# Patient Record
Sex: Female | Born: 1970 | Race: White | Hispanic: No | Marital: Single | Smoking: Former smoker
Health system: Southern US, Community
[De-identification: ages and names within clinical notes are randomized; demographics above are authoritative.]

## PROBLEM LIST (undated history)

## (undated) DIAGNOSIS — F329 Major depressive disorder, single episode, unspecified: Secondary | ICD-10-CM

## (undated) DIAGNOSIS — M1990 Unspecified osteoarthritis, unspecified site: Secondary | ICD-10-CM

## (undated) DIAGNOSIS — M940 Chondrocostal junction syndrome [Tietze]: Secondary | ICD-10-CM

## (undated) DIAGNOSIS — L42 Pityriasis rosea: Secondary | ICD-10-CM

## (undated) DIAGNOSIS — F32A Depression, unspecified: Secondary | ICD-10-CM

## (undated) DIAGNOSIS — T7840XA Allergy, unspecified, initial encounter: Secondary | ICD-10-CM

## (undated) DIAGNOSIS — R519 Headache, unspecified: Secondary | ICD-10-CM

## (undated) DIAGNOSIS — M549 Dorsalgia, unspecified: Secondary | ICD-10-CM

## (undated) DIAGNOSIS — K219 Gastro-esophageal reflux disease without esophagitis: Secondary | ICD-10-CM

## (undated) DIAGNOSIS — N200 Calculus of kidney: Secondary | ICD-10-CM

## (undated) HISTORY — DX: Headache, unspecified: R51.9

## (undated) HISTORY — DX: Allergy, unspecified, initial encounter: T78.40XA

## (undated) HISTORY — PX: ABDOMINAL HYSTERECTOMY: SHX81

## (undated) HISTORY — DX: Depression, unspecified: F32.A

## (undated) HISTORY — PX: OTHER SURGICAL HISTORY: SHX169

## (undated) HISTORY — PX: LAPAROSCOPY: SHX197

## (undated) HISTORY — DX: Major depressive disorder, single episode, unspecified: F32.9

## (undated) HISTORY — PX: WISDOM TOOTH EXTRACTION: SHX21

## (undated) HISTORY — PX: LEEP: SHX91

## (undated) HISTORY — PX: TONSILLECTOMY: SUR1361

---

## 1997-12-10 ENCOUNTER — Other Ambulatory Visit: Admission: RE | Admit: 1997-12-10 | Discharge: 1997-12-10 | Payer: Self-pay | Admitting: Obstetrics & Gynecology

## 1998-06-28 ENCOUNTER — Other Ambulatory Visit: Admission: RE | Admit: 1998-06-28 | Discharge: 1998-06-28 | Payer: Self-pay | Admitting: Obstetrics & Gynecology

## 1999-08-11 ENCOUNTER — Other Ambulatory Visit: Admission: RE | Admit: 1999-08-11 | Discharge: 1999-08-11 | Payer: Self-pay | Admitting: Obstetrics & Gynecology

## 2000-11-19 ENCOUNTER — Other Ambulatory Visit: Admission: RE | Admit: 2000-11-19 | Discharge: 2000-11-19 | Payer: Self-pay | Admitting: *Deleted

## 2001-03-21 ENCOUNTER — Emergency Department (HOSPITAL_COMMUNITY): Admission: EM | Admit: 2001-03-21 | Discharge: 2001-03-21 | Payer: Self-pay | Admitting: Emergency Medicine

## 2001-03-29 ENCOUNTER — Ambulatory Visit (HOSPITAL_COMMUNITY): Admission: RE | Admit: 2001-03-29 | Discharge: 2001-03-29 | Payer: Self-pay | Admitting: Urology

## 2001-03-29 ENCOUNTER — Encounter: Payer: Self-pay | Admitting: Urology

## 2002-01-23 ENCOUNTER — Other Ambulatory Visit: Admission: RE | Admit: 2002-01-23 | Discharge: 2002-01-23 | Payer: Self-pay | Admitting: Obstetrics & Gynecology

## 2003-03-03 ENCOUNTER — Other Ambulatory Visit: Admission: RE | Admit: 2003-03-03 | Discharge: 2003-03-03 | Payer: Self-pay | Admitting: Obstetrics & Gynecology

## 2004-06-30 ENCOUNTER — Encounter (INDEPENDENT_AMBULATORY_CARE_PROVIDER_SITE_OTHER): Payer: Self-pay | Admitting: *Deleted

## 2004-06-30 ENCOUNTER — Ambulatory Visit (HOSPITAL_COMMUNITY): Admission: RE | Admit: 2004-06-30 | Discharge: 2004-06-30 | Payer: Self-pay | Admitting: Obstetrics & Gynecology

## 2006-11-30 ENCOUNTER — Encounter (INDEPENDENT_AMBULATORY_CARE_PROVIDER_SITE_OTHER): Payer: Self-pay | Admitting: Specialist

## 2006-11-30 ENCOUNTER — Ambulatory Visit (HOSPITAL_COMMUNITY): Admission: RE | Admit: 2006-11-30 | Discharge: 2006-12-01 | Payer: Self-pay | Admitting: Obstetrics & Gynecology

## 2006-12-18 ENCOUNTER — Encounter: Admission: RE | Admit: 2006-12-18 | Discharge: 2006-12-18 | Payer: Self-pay | Admitting: Family Medicine

## 2007-01-26 ENCOUNTER — Encounter: Admission: RE | Admit: 2007-01-26 | Discharge: 2007-01-26 | Payer: Self-pay | Admitting: Family Medicine

## 2007-02-21 ENCOUNTER — Encounter: Admission: RE | Admit: 2007-02-21 | Discharge: 2007-02-21 | Payer: Self-pay | Admitting: Family Medicine

## 2008-11-26 ENCOUNTER — Encounter: Admission: RE | Admit: 2008-11-26 | Discharge: 2008-11-26 | Payer: Self-pay | Admitting: Family Medicine

## 2009-02-22 ENCOUNTER — Ambulatory Visit (HOSPITAL_BASED_OUTPATIENT_CLINIC_OR_DEPARTMENT_OTHER): Admission: RE | Admit: 2009-02-22 | Discharge: 2009-02-22 | Payer: Self-pay | Admitting: Urology

## 2009-07-20 ENCOUNTER — Encounter: Admission: RE | Admit: 2009-07-20 | Discharge: 2009-07-20 | Payer: Self-pay | Admitting: Anesthesiology

## 2009-09-28 ENCOUNTER — Encounter: Admission: RE | Admit: 2009-09-28 | Discharge: 2009-09-28 | Payer: Self-pay | Admitting: Anesthesiology

## 2009-12-13 ENCOUNTER — Encounter: Admission: RE | Admit: 2009-12-13 | Discharge: 2009-12-13 | Payer: Self-pay | Admitting: Anesthesiology

## 2010-02-21 ENCOUNTER — Encounter: Admission: RE | Admit: 2010-02-21 | Discharge: 2010-02-21 | Payer: Self-pay | Admitting: Physician Assistant

## 2010-03-08 ENCOUNTER — Encounter: Admission: RE | Admit: 2010-03-08 | Discharge: 2010-03-08 | Payer: Self-pay | Admitting: Family Medicine

## 2010-09-22 ENCOUNTER — Encounter
Admission: RE | Admit: 2010-09-22 | Discharge: 2010-09-22 | Payer: Self-pay | Source: Home / Self Care | Attending: Family Medicine | Admitting: Family Medicine

## 2010-12-05 LAB — POCT HEMOGLOBIN-HEMACUE: Hemoglobin: 14.5 g/dL (ref 12.0–15.0)

## 2010-12-05 LAB — POCT PREGNANCY, URINE: Preg Test, Ur: NEGATIVE

## 2010-12-20 ENCOUNTER — Other Ambulatory Visit: Payer: Self-pay | Admitting: Anesthesiology

## 2010-12-20 DIAGNOSIS — M545 Low back pain, unspecified: Secondary | ICD-10-CM

## 2010-12-20 DIAGNOSIS — M542 Cervicalgia: Secondary | ICD-10-CM

## 2010-12-24 ENCOUNTER — Ambulatory Visit
Admission: RE | Admit: 2010-12-24 | Discharge: 2010-12-24 | Disposition: A | Discharge status: Home / Self Care | Payer: BC Managed Care – PPO | Source: Ambulatory Visit | Attending: Anesthesiology | Admitting: Anesthesiology

## 2010-12-24 DIAGNOSIS — M545 Low back pain, unspecified: Secondary | ICD-10-CM

## 2010-12-24 DIAGNOSIS — M542 Cervicalgia: Secondary | ICD-10-CM

## 2011-01-10 NOTE — Op Note (Signed)
Sherry [name], Sherry [name]            ACCOUNT NO.:  [id number]   MEDICAL RECORD NO.:  [id number]          PATIENT TYPE:  AMB   LOCATION:  [facility]                         FACILITY:  [facility]   PHYSICIAN:  Mark C. Vernie Ammons, M.D.  DATE OF BIRTH:  1970-12-15   DATE OF PROCEDURE:  02/22/2009  DATE OF DISCHARGE:                               OPERATIVE REPORT   PREOPERATIVE DIAGNOSES:  1. Right ureteral calculus.  2. Left-sided flank pain.   POSTOPERATIVE DIAGNOSES:  1. Right ureteral calculus.  2. Left-sided flank pain.   PROCEDURE:  1. Bilateral retrograde pyelograms with interpretation.  2. Right ureteroscopy.  3. In situ laser lithotripsy.  4. Right ureteral stone extraction.  5. Right double-J stent placement (5-French, 24 cm with string).   SURGEON:  Mark C. Vernie Ammons, M.D.   ANESTHESIA:  General.   SPECIMENS:  Stone given to the patient.   DRAIN:  Double-J stent in the right ureter.   BLOOD LOSS:  Minimal.   COMPLICATIONS:  None.   INDICATIONS:  The patient is a 40 year old white female who was found to  have a right mid ureteral calculus that was followed with serial KUBs  and medical expulsive therapy.  Despite conservative therapy, the stone  did not progress and we discussed ureteroscopic treatment of the stone.  I have gone over the risks, complications and alternatives.  The patient  understands and has elected to proceed.  In the preop holding area she  mentioned that she was having some left-sided pain as well and I  indicated I would also evaluate the left side with a retrograde  pyelogram.   DESCRIPTION OF OPERATION:  After informed consent, the patient was  brought to the major OR, placed on the table, administered general  anesthesia, then moved to the dorsal lithotomy position.  Her genitalia  was sterilely and prepped and draped and an official time-out was then  performed.   The 22-French cystoscope with 12 degrees lens was then passed in the  bladder and  bladder was fully and systematically inspected and noted to  be free of any tumor, stones or inflammatory lesions.  Ureteral orifices  were of normal configuration and position.   A 6-French open-ended ureteral catheter was then passed through the  cystoscope and into the right ureteral orifice.  Full strength contrast  was then used to perform a retrograde pyelogram in standard fashion  using real time fluoroscopy.  This revealed a normal-appearing ureter up  to the level of L3 where the stone was previously seen on KUB which was  noted as a filling defect in the ureter.  The ureter proximal to that  appeared normal in appearance.  There was no evidence of hydronephrosis.   A 0.038 inches floppy tip guidewire was then passed through the open-  ended catheter and up to the stone but with gentle manipulation, I was  unable to negotiate the guidewire past the stone and therefore left it  at that location just below the stone.   A left retrograde pyelogram was then performed in identical fashion.  The findings were those of a normal ureter  throughout its length.  There  was no evidence of filling defect, mass effect or other abnormality.  The contrast was noted to pass up the ureter unobstructed and then  observed under fluoroscopy to pass down the ureter unobstructed as well.   The inner cannula of the ureteral access sheath was first passed over  the guidewire that was going up the right ureter to gently dilate the  intramural ureter.  I then removed that and placed the inner cannula  within the ureteral access sheath and passed this over the guidewire to  just below the location of the stone removing the guidewire and inner  cannula and leaving the ureteral access sheath in place.   A 6-French flexible digital ureteroscope was then passed through the  access sheath and into the ureter.  I was able to identify the stone and  it was photographed.  There was a lot of edema of the ureter  surrounding  the stone.  A 200 micron holmium laser fiber was passed through the  ureteroscope and used to fragment the stone.  I then removed the laser  fiber and inserted the nitinol basket and grasped the largest fragment  and removed that without difficulty.  I then passed the scope back in  and removed the remaining fragments.   The guidewire was then passed through the ureteroscope and the  ureteroscope removed with the guide wire being left in place and the  cystoscope was back loaded over the guidewire.  The stent was then  passed over the guidewire and as the guidewire was removed, good curl  was noted in the renal pelvis.  The bladder was drained and the  cystoscope removed.  The patient received a B and O suppository and the  stent tether was affixed to the mons pubis region.  The patient  tolerated procedure well, and there were no intraoperative  complications.  She will be given a prescription for Pyridium 200 mg #40  and Vicodin HP #36.  She will then follow up in my office in 1 week for  stent removal.      Mark C. Vernie Ammons, M.D.  Electronically Signed     MCO/MEDQ  D:  02/22/2009  T:  02/22/2009  Job:  [id number]

## 2011-01-13 NOTE — Op Note (Signed)
[name], [name]            ACCOUNT NO.:  [id number]   MEDICAL RECORD NO.:  [id number]          PATIENT TYPE:  AMB   LOCATION:  DAY                          FACILITY:  [facility]   PHYSICIAN:  Genia Del, M.D.DATE OF BIRTH:  July 14, 1971   DATE OF PROCEDURE:  11/30/2006  DATE OF DISCHARGE:                               OPERATIVE REPORT   OPERATIVE PROTOCOL:   PREOPERATIVE DIAGNOSIS:  Pelvic pains and endometriosis. Persistent low-  grade squamous intraepithelial lesion, and vulvar and perianal  condylomas.   PROCEDURE:  Total laparoscopy hysterectomy, assisted with DaVinci robot,  and the laser CO2 of vulvar and perianal lesions.   SURGEON:  Genia Del, M.D.   ASSISTANT:  Cordelia Pen A. Rosalio Macadamia, M.D.   PROCEDURE:  Under general anesthesia with endotracheal intubation, the  patient is in lithotomy position.  She is prepped with Betadine on the  abdominal, suprapubic, vulvar, and vaginal areas.  The bladder catheter  is inserted, and the patient is draped as usual.  The Rumi with a Koh  ring are put in place vaginally.  The vaginal exam just prior to that  revealed an anteverted uterus, mobile, no adnexal mass.  We make that  supraumbilical incision with the scalpel over 1.5 cm after infiltrating  Marcaine.  We open layer by layer. The aponeurosis is opened with Mayo  scissors.  A pursestring stitch is put with Vicryl 0 at that level.  The  parietal peritoneum is opened bluntly with a finger.  We insert the  Hasson with a camera at that level.  The inspection of the  abdominopelvic cavity is within normal.  No adhesion is seen.  We do  that after creating a pneumoperitoneum with CO2.  We measure all port  sites. Three robotic ports are inserted and one assistant port.  We then  dock the robot as usual and insert the instruments, an Endoshears  scissor, a fenestrated bipolar, and a Cobra clamp.  We start at the  console.  We cauterize in sections on the left side the  round ligament,  the tube, and the utero-ovarian ligament.  We progress along the left  lateral side of the uterus, open the visceral peritoneum over the lower  uterine segment.  We proceed the same way on the right side and bring  the bladder down anteriorly.  We then cauterize in sections the uterine  artery on the left side and then on the right side.  We noted that the  uterus is blanching. We then open the vagina anteriorly following the  [facility] ring all along. The uterus is completely detached and removed  vaginally.  We then change instruments. Two needle drivers were used,  and the fenestrated bipolar is transferred to the fourth arm. We close  the vagina with figure-of-eight Vicryl 0.  It is closed completely, and  hemostasis is good at all levels.  The instruments are therefore  removed, and the robot is undocked. The trocars are removed. The CO2 is  evacuated.  The suture at the supraumbilical incision is attached on the  aponeurosis, and we do a subcuticular Vicryl 0 at all  incisions and add  Dermabond on top.  Note that both ovaries were normal to inspection.  No  endometriosis was seen at that level.  The two tubes were normal in  appearance.  The uterus was normal size.  Minimal inactive endometriosis  was present at the posterior aspect of the uterus toward the cul-de-sac.  We then go to the vulva and perianal areas and use the CO2 laser to  fulgurate three lesions in the perianal area, one on the posterior right  vulva and one on the mid anterior left vulva.  The perianal lesions look  like condyloma. The right vulvar lesion looks like probable old  molluscum contagiosum,  and the left anterior mid vulvar lesion looked like possible condyloma  or mild dysplasia. The estimated blood loss was less than 50 cc.  No  complication occurred.  The patient received Ancef 1 g IV at the  beginning of the intervention. The count of instruments and sponges was  complete.       Genia Del, M.D.  Electronically Signed     ML/MEDQ  D:  11/30/2006  T:  11/30/2006  Job:  [id number]

## 2011-01-13 NOTE — Op Note (Signed)
[facility]  Patient:    Sherry [name], Sherry [name]                   MRN: [id number] Proc. Date: 03/29/01 Adm. Date:  [id number] Attending:  Trisha Mangle                           Operative Report  PREOPERATIVE DIAGNOSIS:  Right ureteral calculus.  POSTOPERATIVE DIAGNOSIS:  Right ureteral calculus.  OPERATION: 1. Cystoscopy. 2. Right ureteroscopy and stone extraction.  SURGEON:  Mark C. Vernie Ammons, M.D.  ANESTHESIA:  General.  SPECIMEN:  Stone given to patient.  DRAINS:  None.  BLOOD LOSS:  None.  COMPLICATIONS:  None.  INDICATIONS:  The patient is a 40 year old white female white female who is having intermittent right flank pain.  A CT and followup KUB revealed a right ureteral calculus.  She began to have more pain and some irritative voiding symptoms consistent with progression of the stone, and it was found to be more distal in the ureter.  She had not passed the stone, and I offered her continued observation versus extraction of the stone.  We also discussed the use of a stent, but, with its distal location, we also discussed possible procedure without the use of the stent, and she would like to proceed with that, understanding the risks and complications and limitations associated with this including ureteral spasm and pain.  She did receive preoperative Cipro and has elected to procedure with surgery.  DESCRIPTION OF PROCEDURE:  After informed consent, the patient was brought to the major OR, was placed on the table, and administered general anesthesia and then moved into the dorsolithotomy position.  Her genitalia was sterilely prepped and draped, and a 6-French ureteroscope was then introduced into the bladder.  The bladder was noted to be free of any tumors, stones, or inflammatory lesions.  The right ureteral orifice was identified, and 0.038-inch floppy tip guidewire was then passed up the right ureter under direct fluoroscopic  control.  Next to the guidewire then, I passed the 6-French ureteroscope without difficulty or need to dilate.  I was able to identify the stone and remove the guidewire.  I then passed a Segura basket through the ureteroscope and engaged the stone which was seen visually and fluoroscopically.  I then extracted the stone, basket, and ureteroscope without difficulty.  The bladder was drained.  The patient was awakened and taken to the recovery room in stable and satisfactory condition.  She tolerated the procedure well with no intraoperative complications.  She was given a #16A B & O suppository at the end of the operation and will be given a prescription for 28 Vicodin ES and will follow up in my office in one week for recheck. DD:  03/29/01 TD:  03/30/01 Job: [id number]

## 2011-01-13 NOTE — Op Note (Signed)
Sherry [name], Sherry [name]            ACCOUNT NO.:  [id number]   MEDICAL RECORD NO.:  [id number]          PATIENT TYPE:  AMB   LOCATION:  [facility]                           FACILITY:  [facility]   PHYSICIAN:  Genia Del, M.D.DATE OF BIRTH:  07-20-1971   DATE OF PROCEDURE:  06/30/2004  DATE OF DISCHARGE:                                 OPERATIVE REPORT   PREOPERATIVE DIAGNOSES:  1.  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  2.  History of loop electrosurgical excision procedure associated with      severe pain in 1998.   POSTOPERATIVE DIAGNOSES:  1.  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  2.  History of loop electrosurgical excision procedure associated with      severe pain in 1998.   INTERVENTION:  Colposcopy with LEEP under MAC analgesia and paracervical and  cervical block.   SURGEON:  Genia Del, M.D.   ANESTHESIA:  Burnett Corrente, M.D.   DESCRIPTION OF PROCEDURE:  Under MAC analgesia, the patient is in lithotomy  position. She is draped as usual. No lesion is seen on the vulva. The  speculum is introduced in the vagina.  Acetic acid is applied on the cervix  and colposcopy is done. We note moderate acetowhite change with moderate  punctation in the quadrant from 6-9 o'clock and minor acetowhite and minor  punctation around the rest of the transformation zone. We do a paracervical  and cervical block with a total of 30 mL of Marcaine 5% with epinephrine.  The paracervical block is done at 4 and 8 o'clock and the cervical block at  4, 8 and 12 o'clock. We then proceed with the LEEP with cutting current  starting just external to the area of moderate punctation at 6-9 o'clock and  proceed to finish the LEEP between 12 and 3 o'clock. The specimen is marked  at 12 o'clock and sent to pathology. We then complete hemostasis with the  __________ with coag current and then with Monsel.  Hemostasis is adequate.  Estimated blood loss was minimal. No complication occurred and the patient  was transferred to the  recovery room in good status.      ML/MEDQ  D:  06/30/2004  T:  06/30/2004  Job:  [id number]

## 2011-02-10 ENCOUNTER — Other Ambulatory Visit: Payer: Self-pay | Admitting: Physician Assistant

## 2011-02-10 DIAGNOSIS — M545 Low back pain, unspecified: Secondary | ICD-10-CM

## 2011-02-16 ENCOUNTER — Ambulatory Visit
Admission: RE | Admit: 2011-02-16 | Discharge: 2011-02-16 | Disposition: A | Discharge status: Home / Self Care | Payer: BC Managed Care – PPO | Source: Ambulatory Visit | Attending: Physician Assistant | Admitting: Physician Assistant

## 2011-02-16 DIAGNOSIS — M545 Low back pain, unspecified: Secondary | ICD-10-CM

## 2011-02-22 ENCOUNTER — Emergency Department (HOSPITAL_BASED_OUTPATIENT_CLINIC_OR_DEPARTMENT_OTHER)
Admission: EM | Admit: 2011-02-22 | Discharge: 2011-02-22 | Disposition: A | Discharge status: Home / Self Care | Payer: BC Managed Care – PPO | Attending: Emergency Medicine | Admitting: Emergency Medicine

## 2011-02-22 ENCOUNTER — Emergency Department (INDEPENDENT_AMBULATORY_CARE_PROVIDER_SITE_OTHER): Payer: BC Managed Care – PPO

## 2011-02-22 DIAGNOSIS — M542 Cervicalgia: Secondary | ICD-10-CM

## 2011-02-22 DIAGNOSIS — M545 Low back pain, unspecified: Secondary | ICD-10-CM

## 2011-02-22 DIAGNOSIS — Y92410 Unspecified street and highway as the place of occurrence of the external cause: Secondary | ICD-10-CM | POA: Insufficient documentation

## 2011-02-22 DIAGNOSIS — G8929 Other chronic pain: Secondary | ICD-10-CM | POA: Insufficient documentation

## 2011-02-22 DIAGNOSIS — Z79899 Other long term (current) drug therapy: Secondary | ICD-10-CM | POA: Insufficient documentation

## 2011-02-22 DIAGNOSIS — M5137 Other intervertebral disc degeneration, lumbosacral region: Secondary | ICD-10-CM

## 2011-02-22 DIAGNOSIS — M47812 Spondylosis without myelopathy or radiculopathy, cervical region: Secondary | ICD-10-CM

## 2011-05-03 ENCOUNTER — Other Ambulatory Visit: Payer: Self-pay | Admitting: Obstetrics & Gynecology

## 2011-05-03 DIAGNOSIS — Z1231 Encounter for screening mammogram for malignant neoplasm of breast: Secondary | ICD-10-CM

## 2011-05-23 ENCOUNTER — Ambulatory Visit
Admission: RE | Admit: 2011-05-23 | Discharge: 2011-05-23 | Disposition: A | Discharge status: Home / Self Care | Payer: PRIVATE HEALTH INSURANCE | Source: Ambulatory Visit | Attending: Obstetrics & Gynecology | Admitting: Obstetrics & Gynecology

## 2011-05-23 DIAGNOSIS — Z1231 Encounter for screening mammogram for malignant neoplasm of breast: Secondary | ICD-10-CM

## 2011-07-30 ENCOUNTER — Encounter: Payer: Self-pay | Admitting: *Deleted

## 2011-07-30 ENCOUNTER — Emergency Department (INDEPENDENT_AMBULATORY_CARE_PROVIDER_SITE_OTHER): Payer: BC Managed Care – PPO

## 2011-07-30 ENCOUNTER — Emergency Department (HOSPITAL_BASED_OUTPATIENT_CLINIC_OR_DEPARTMENT_OTHER)
Admission: EM | Admit: 2011-07-30 | Discharge: 2011-07-31 | Disposition: A | Discharge status: Home / Self Care | Payer: BC Managed Care – PPO | Attending: Emergency Medicine | Admitting: Emergency Medicine

## 2011-07-30 DIAGNOSIS — K625 Hemorrhage of anus and rectum: Secondary | ICD-10-CM

## 2011-07-30 DIAGNOSIS — J984 Other disorders of lung: Secondary | ICD-10-CM | POA: Insufficient documentation

## 2011-07-30 DIAGNOSIS — N63 Unspecified lump in unspecified breast: Secondary | ICD-10-CM

## 2011-07-30 DIAGNOSIS — J45909 Unspecified asthma, uncomplicated: Secondary | ICD-10-CM | POA: Insufficient documentation

## 2011-07-30 DIAGNOSIS — K219 Gastro-esophageal reflux disease without esophagitis: Secondary | ICD-10-CM | POA: Insufficient documentation

## 2011-07-30 DIAGNOSIS — N949 Unspecified condition associated with female genital organs and menstrual cycle: Secondary | ICD-10-CM

## 2011-07-30 DIAGNOSIS — Z8739 Personal history of other diseases of the musculoskeletal system and connective tissue: Secondary | ICD-10-CM | POA: Insufficient documentation

## 2011-07-30 DIAGNOSIS — K6289 Other specified diseases of anus and rectum: Secondary | ICD-10-CM | POA: Insufficient documentation

## 2011-07-30 DIAGNOSIS — R911 Solitary pulmonary nodule: Secondary | ICD-10-CM

## 2011-07-30 DIAGNOSIS — R109 Unspecified abdominal pain: Secondary | ICD-10-CM

## 2011-07-30 HISTORY — DX: Dorsalgia, unspecified: M54.9

## 2011-07-30 HISTORY — DX: Unspecified osteoarthritis, unspecified site: M19.90

## 2011-07-30 HISTORY — DX: Gastro-esophageal reflux disease without esophagitis: K21.9

## 2011-07-30 HISTORY — DX: Pityriasis rosea: L42

## 2011-07-30 HISTORY — DX: Calculus of kidney: N20.0

## 2011-07-30 HISTORY — DX: Chondrocostal junction syndrome (tietze): M94.0

## 2011-07-30 LAB — COMPREHENSIVE METABOLIC PANEL
ALT: 10 U/L (ref 0–35)
AST: 15 U/L (ref 0–37)
Albumin: 4.2 g/dL (ref 3.5–5.2)
CO2: 25 mEq/L (ref 19–32)
Calcium: 9.6 mg/dL (ref 8.4–10.5)
Chloride: 102 mEq/L (ref 96–112)
GFR calc non Af Amer: >90 mL/min (ref >90)
Sodium: 137 mEq/L (ref 135–145)
Total Bilirubin: 0.2 mg/dL — ABNORMAL LOW (ref 0.3–1.2)

## 2011-07-30 LAB — CBC
HCT: 40.7 % (ref 36.0–46.0)
MCHC: 34.9 g/dL (ref 30.0–36.0)
Platelets: 441 10*3/uL — ABNORMAL HIGH (ref 150–400)
RDW: 12.4 % (ref 11.5–15.5)
WBC: 13.1 10*3/uL — ABNORMAL HIGH (ref 4.0–10.5)

## 2011-07-30 MED ORDER — METRONIDAZOLE 500 MG PO TABS: 500.0000 mg | ORAL_TABLET | Freq: Two times a day (BID) | ORAL | Status: AC

## 2011-07-30 MED ORDER — IOHEXOL 300 MG/ML  SOLN: 100.0000 mL | Freq: Once | INTRAMUSCULAR | Status: AC | PRN

## 2011-07-30 MED ORDER — CIPROFLOXACIN HCL 500 MG PO TABS: 500.0000 mg | ORAL_TABLET | Freq: Two times a day (BID) | ORAL | Status: AC

## 2011-07-30 MED ORDER — DIPHENHYDRAMINE HCL 50 MG/ML IJ SOLN
25.0000 mg | Freq: Once | INTRAMUSCULAR | Status: AC
  Administered 2011-07-30: 22:00:00 via INTRAVENOUS
  Filled 2011-07-30: qty 1

## 2011-07-30 NOTE — ED Notes (Signed)
Pt reports blood and mucous in BM x 4 weeks- has appt on Friday with GI doctor- today pt reports sx worsening with cramping- has been taking herbs with some relief

## 2011-07-30 NOTE — ED Notes (Signed)
Pt reports rectal bleeding over past month with abd discomfort and R inguinal pain

## 2011-07-30 NOTE — ED Provider Notes (Signed)
History     CSN: [id number] Arrival date & time: 07/30/2011  8:00 PM   First MD Initiated Contact with Patient 07/30/11 2058      Chief Complaint  Patient presents with  . Rectal Bleeding    (Consider location/radiation/quality/duration/timing/severity/associated sxs/prior treatment) HPI Comments: Pt states that she is having mucus tinged blood:pt states that she had the symptoms after doing an herbal cleanse:pt states that the symptoms have lasted for 4 weeks:pt states that she has no history  Of similar symptoms:pt denies n/v/d or fever:pt states that she has an appointment with gi in 5 days  Patient is a 40 y.o. female presenting with hematochezia. The history is provided by the patient. No language interpreter was used.  Rectal Bleeding  The current episode started more than 2 weeks ago. The problem occurs continuously. The problem has been unchanged. The pain is mild. The stool is described as mixed with blood and streaked with blood. There was no prior successful therapy. Pertinent negatives include no hemorrhoids, no nausea, no chest pain and no coughing.    Past Medical History  Diagnosis Date  . Arthritis   . Asthma   . Kidney stones   . GERD (gastroesophageal reflux disease)   . Pityriasis rosea   . Costochondritis   . Back pain     Past Surgical History  Procedure Date  . Abdominal hysterectomy   . Leep   . Tonsillectomy   . Wisdom tooth extraction   . Laparoscopy   . Kidney stones     No family history on file.  History  Substance Use Topics  . Smoking status: Former Games developer  . Smokeless tobacco: Not on file  . Alcohol Use: Yes     occasional    OB History    Grav Para Term Preterm Abortions TAB SAB Ect Mult Living                  Review of Systems  Respiratory: Negative for cough.   Cardiovascular: Negative for chest pain.  Gastrointestinal: Positive for hematochezia. Negative for nausea and hemorrhoids.  All other systems reviewed and are  negative.    Allergies  Latex and Iohexol  Home Medications   Current Outpatient Rx  Name Route Sig Dispense Refill  . ALPRAZOLAM 0.5 MG PO TABS Oral Take 0.5 mg by mouth 2 (two) times daily as needed. For panic attack      . FLUOXETINE HCL 40 MG PO CAPS Oral Take 40 mg by mouth daily.      Marland Kitchen HYDROCODONE-ACETAMINOPHEN 10-325 MG PO TABS Oral Take 1-2 tablets by mouth every 8 (eight) hours as needed. For pain      . LORATADINE-PSEUDOEPHEDRINE ER 5-120 MG PO TB12 Oral Take 1 tablet by mouth daily.      Marland Kitchen PRESCRIPTION MEDICATION Topical Apply 1 application topically daily as needed. Psoriasis cream     . RANITIDINE HCL 150 MG PO TABS Oral Take 150 mg by mouth 2 (two) times daily as needed. For reflux       BP 112/80  Pulse 88  Temp(Src) 98.3 F (36.8 C) (Oral)  Resp 20  Ht 5\' 7"  (1.702 m)  Wt 200 lb (90.719 kg)  BMI 31.32 kg/m2  SpO2 98%  Physical Exam  Nursing note and vitals reviewed. Constitutional: She appears well-developed and well-nourished.  Neck: Normal range of motion. Neck supple.  Cardiovascular: Normal rate and regular rhythm.   Pulmonary/Chest: Effort normal and breath sounds normal.  Abdominal: Soft.  There is tenderness.  Genitourinary:       Pt no gross red blood on exam  Musculoskeletal: Normal range of motion.  Neurological: She is alert.  Skin: Skin is warm and dry.  Psychiatric: She has a normal mood and affect.    ED Course  Procedures (including critical care time)  Labs Reviewed  CBC - Abnormal; Notable for the following:    WBC 13.1 (*)    Platelets 441 (*)    All other components within normal limits  COMPREHENSIVE METABOLIC PANEL - Abnormal; Notable for the following:    Total Bilirubin 0.2 (*)    All other components within normal limits  OCCULT BLOOD X 1 CARD TO LAB, STOOL  POCT OCCULT BLOOD STOOL, DEVICE   Ct Abdomen Pelvis W Contrast  07/30/2011  *RADIOLOGY REPORT*  Clinical Data: Worsening pelvic pain and cramping; blood and  mucus in bowel movements.  CT ABDOMEN AND PELVIS WITH CONTRAST  Technique:  Multidetector CT imaging of the abdomen and pelvis was performed following the standard protocol during bolus administration of intravenous contrast.  Contrast:  100 mL of Omnipaque 300 IV contrast  Comparison: CT of the lumbar spine performed 02/16/2011  Findings: There is an unusual mass-like appearance to partially visualized fibroglandular tissue within the left breast.  Though this could reflect unusually prominent cystic lesions, a 3.7 cm mass cannot be excluded.  Correlation with prior mammograms, and follow-up mammogram, are recommended for further evaluation.  There is a small 6 mm pleural based nodule noted at the left lung base (image 7 of 30), and a small 4 mm nodule noted more inferiorly at the left lung base (image 13 of 30).  A smaller nodular density within the inferior right middle lobe is nonspecific in appearance.  The liver and spleen are unremarkable in appearance.  The gallbladder is within normal limits.  The pancreas and adrenal glands are unremarkable.  The kidneys are unremarkable in appearance.  There is no evidence of hydronephrosis.  No renal or ureteral stones are seen.  No perinephric stranding is appreciated.  No free fluid is identified.  The small bowel is unremarkable in appearance.  The stomach is within normal limits.  No acute vascular abnormalities are seen.  A circumaortic left renal vein is incidentally noted.  The appendix is normal in caliber and contains air, without evidence for appendicitis.  Mild apparent mucosal thickening along the ascending colon likely reflects relative decompression.  The colon is grossly unremarkable in appearance.  Mild diffuse wall thickening along the inferior rectum may reflect proctitis, but is nonspecific in appearance.  The bladder is mildly distended and grossly remarkable in appearance.  The patient is status post hysterectomy; the ovaries are relatively  symmetric.  No suspicious adnexal masses are seen. No inguinal lymphadenopathy is seen.  No acute osseous abnormalities are identified.  Disc space narrowing is noted at L5-S1, with associated endplate sclerotic change.  IMPRESSION:  1.  Mild diffuse wall thickening along the inferior rectum is nonspecific, though it may reflect proctitis. 2.  Unusual mass-like appearance to partially visualized fibroglandular tissue within the left breast.  Though this could reflect unusually prominent cystic lesions, a 3.7 cm left breast mass cannot be excluded.  Correlation with prior mammograms, and follow-up mammogram and breast exam, are recommended for further evaluation. 3.  Two small nodules noted at the left lung base, the larger of which measures 6 mm in size.  If the patient is at high risk for bronchogenic carcinoma,  follow-up chest CT at 6-12 months is recommended.  If the patient is at low risk for bronchogenic carcinoma, follow-up chest CT at 12 months is recommended.  This recommendation follows the consensus statement: Guidelines for Management of Small Pulmonary Nodules Detected on CT Scans: A Statement from the Fleischner Society as published in Radiology 2005; 237:395-400. Online at: DietDisorder.cz. 4.  Circumaortic left renal vein incidentally noted.  Original Report Authenticated By: Tonia Ghent, M.D.     1. Proctitis   2. Rectal bleeding   3. Lung nodule   4. Breast mass       MDM  Discussed findings with pt:pt aware of lung mass:pt to follow up with pcp for mammogram:pt to be treated with cipro/flagyl as per discussion with Dr. Jacky Kindle, NP 07/30/11 [phone]

## 2011-07-30 NOTE — ED Notes (Signed)
BSC taken to room

## 2011-07-31 NOTE — ED Provider Notes (Signed)
Medical screening examination/treatment/procedure(s) were performed by non-physician practitioner and as supervising physician I was immediately available for consultation/collaboration.  Vedanshi Massaro, MD 07/31/11 0803 

## 2011-08-01 ENCOUNTER — Other Ambulatory Visit: Payer: Self-pay | Admitting: Family Medicine

## 2011-08-01 ENCOUNTER — Ambulatory Visit
Admission: RE | Admit: 2011-08-01 | Discharge: 2011-08-01 | Disposition: A | Discharge status: Home / Self Care | Payer: BC Managed Care – PPO | Source: Ambulatory Visit | Attending: Family Medicine | Admitting: Family Medicine

## 2011-08-01 DIAGNOSIS — N63 Unspecified lump in unspecified breast: Secondary | ICD-10-CM

## 2011-08-07 ENCOUNTER — Other Ambulatory Visit: Payer: Self-pay | Admitting: Gastroenterology

## 2011-10-24 ENCOUNTER — Other Ambulatory Visit: Payer: Self-pay | Admitting: Physician Assistant

## 2011-11-03 IMAGING — CR DG CHEST 2V
2 series · 2 of 2 positions shown · non-contrast
Comparison: CT chest scan of 11/26/2008

CLINICAL DATA: Pain in the left chest, history of asthma

CHEST - 2 VIEW

[view not recorded (1 of 2)]
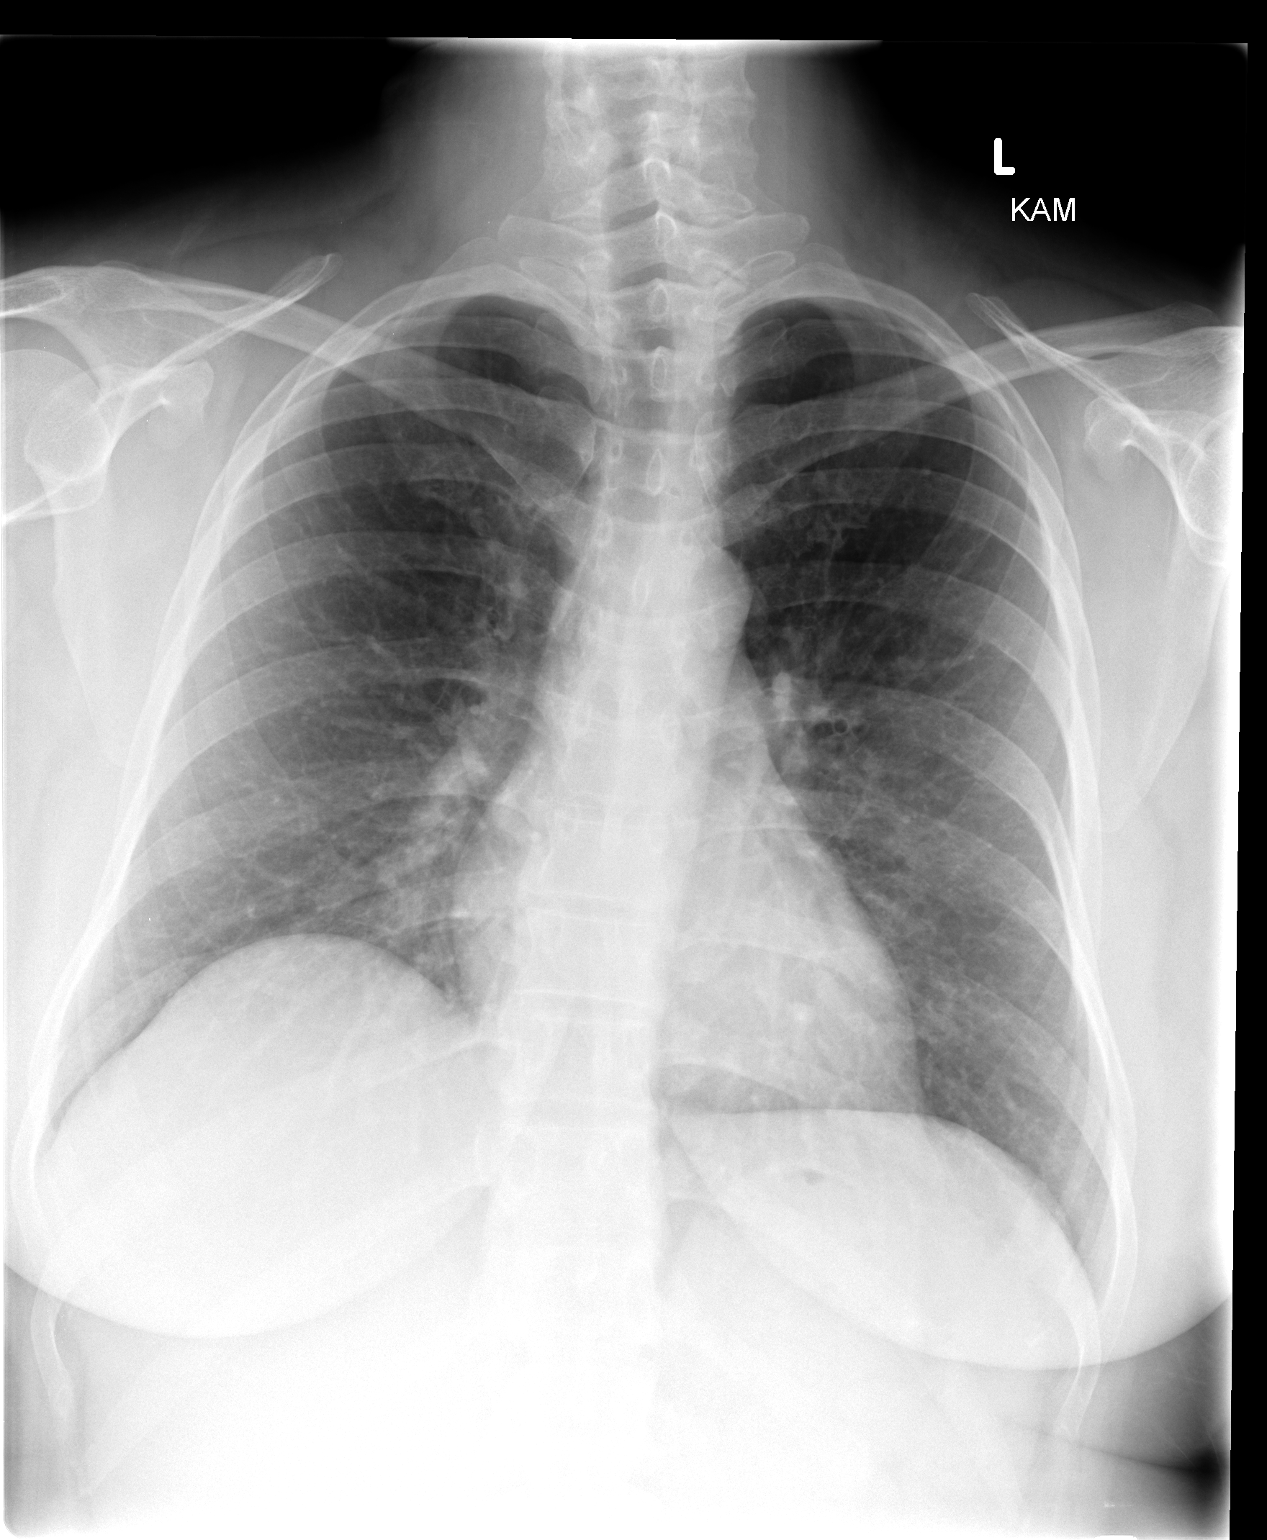

[view not recorded (2 of 2)]
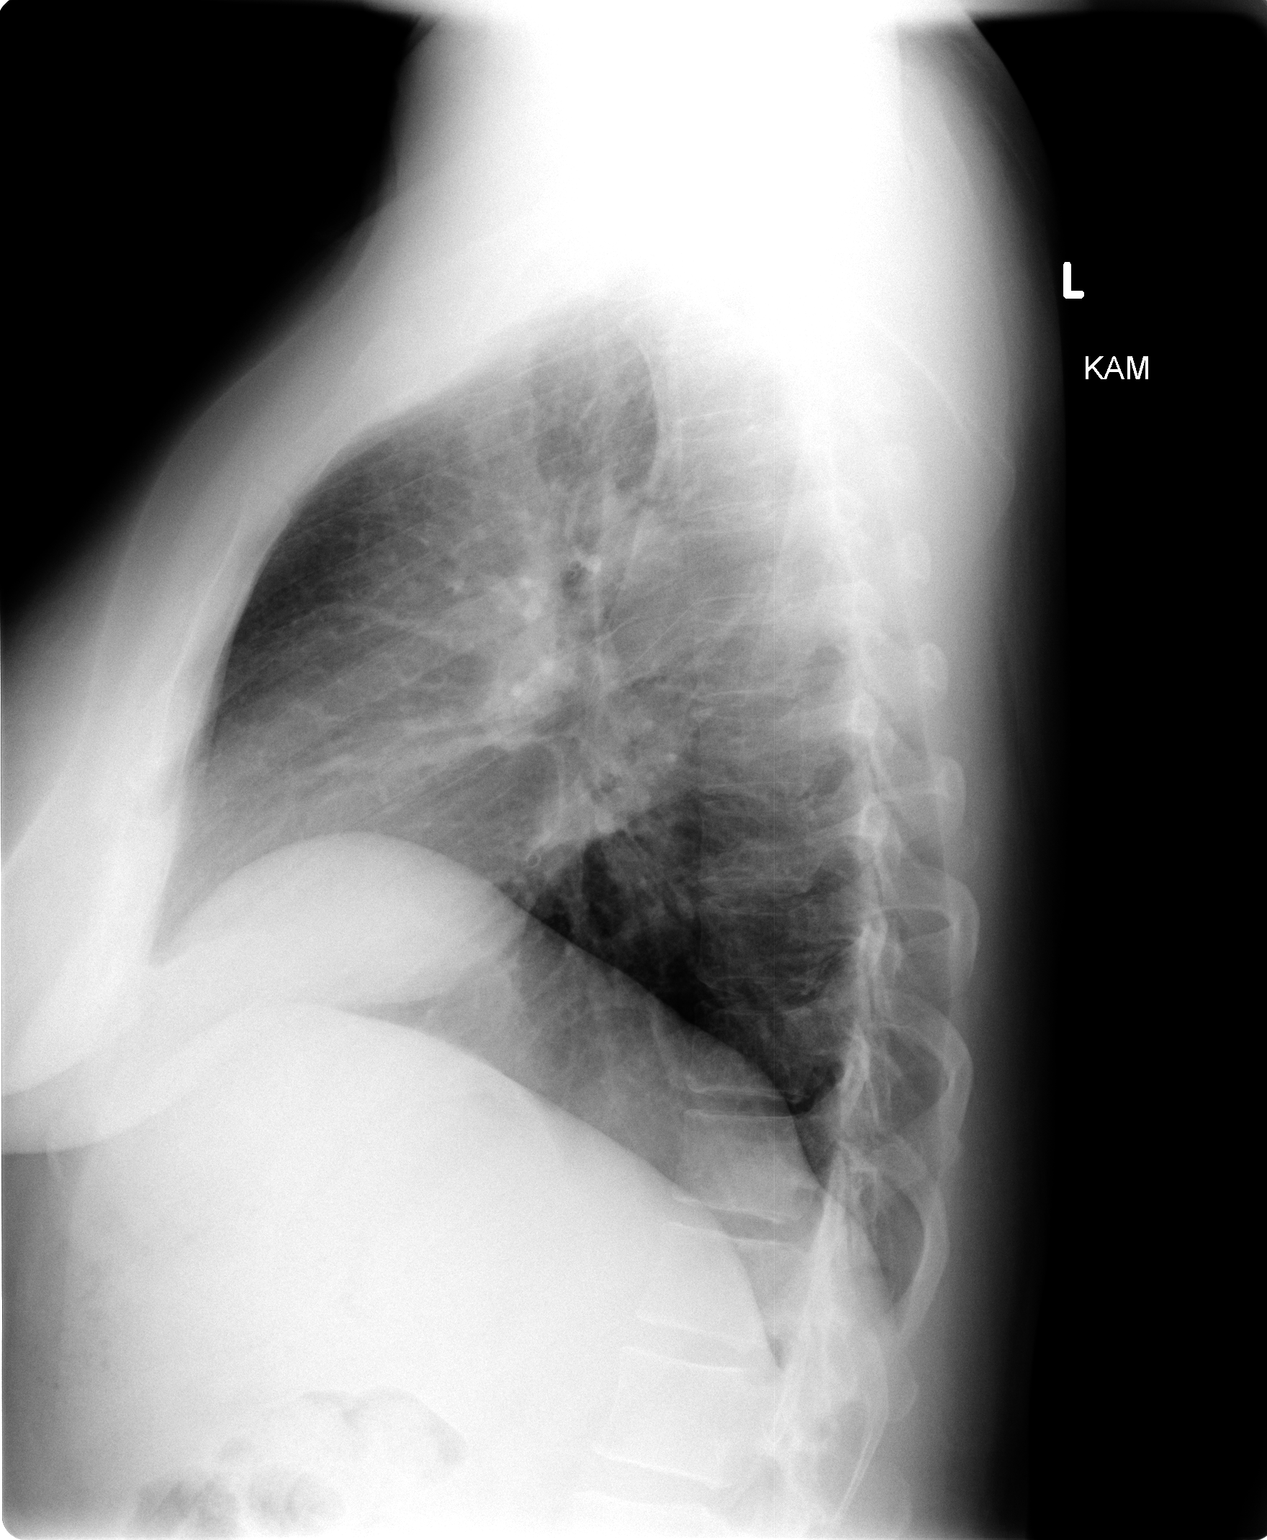

[2 of 2 positions shown; findings below may reference images not displayed]

FINDINGS: The lungs are clear.  There is peribronchial thickening
consistent with bronchitis.  Mediastinal contours are stable.  The
heart is within normal limits in size.  No acute bony abnormality
is seen.
IMPRESSION: No active lung disease.  Mild peribronchial thickening.

## 2011-11-23 DIAGNOSIS — R413 Other amnesia: Secondary | ICD-10-CM

## 2011-11-23 DIAGNOSIS — F39 Unspecified mood [affective] disorder: Secondary | ICD-10-CM

## 2011-12-05 DIAGNOSIS — F39 Unspecified mood [affective] disorder: Secondary | ICD-10-CM

## 2011-12-05 DIAGNOSIS — R413 Other amnesia: Secondary | ICD-10-CM

## 2012-02-02 ENCOUNTER — Other Ambulatory Visit: Payer: Self-pay | Admitting: Physician Assistant

## 2012-04-04 ENCOUNTER — Other Ambulatory Visit: Payer: Self-pay | Admitting: Physician Assistant

## 2012-04-04 ENCOUNTER — Other Ambulatory Visit: Payer: Self-pay | Admitting: *Deleted

## 2012-06-21 ENCOUNTER — Other Ambulatory Visit: Payer: Self-pay | Admitting: Family Medicine

## 2012-06-21 DIAGNOSIS — Z1231 Encounter for screening mammogram for malignant neoplasm of breast: Secondary | ICD-10-CM

## 2012-07-03 ENCOUNTER — Other Ambulatory Visit: Payer: Self-pay | Admitting: Physician Assistant

## 2012-07-04 NOTE — Telephone Encounter (Signed)
Needs OV - third notice

## 2012-07-04 NOTE — Telephone Encounter (Signed)
Chart pulled to PA pool at nurses station MR [id number]

## 2012-07-05 ENCOUNTER — Other Ambulatory Visit: Payer: Self-pay | Admitting: Physician Assistant

## 2012-07-05 NOTE — Telephone Encounter (Signed)
Please pull paper chart.  

## 2012-07-06 ENCOUNTER — Other Ambulatory Visit: Payer: Self-pay | Admitting: *Deleted

## 2012-07-08 NOTE — Telephone Encounter (Signed)
Chart pulled to PA pool at nurses station MR [id number] °

## 2012-08-06 ENCOUNTER — Other Ambulatory Visit: Payer: Self-pay | Admitting: Physician Assistant

## 2013-01-19 ENCOUNTER — Ambulatory Visit (INDEPENDENT_AMBULATORY_CARE_PROVIDER_SITE_OTHER): Payer: BC Managed Care – PPO | Admitting: Family Medicine

## 2013-01-19 ENCOUNTER — Encounter: Payer: Self-pay | Admitting: Family Medicine

## 2013-01-19 VITALS — BP 131/81 | HR 76 | Temp 97.9°F | Resp 16 | Ht 67.5 in | Wt 213.2 lb

## 2013-01-19 DIAGNOSIS — J309 Allergic rhinitis, unspecified: Secondary | ICD-10-CM

## 2013-01-19 DIAGNOSIS — J029 Acute pharyngitis, unspecified: Secondary | ICD-10-CM

## 2013-01-19 MED ORDER — FLUCONAZOLE 150 MG PO TABS: 150.0000 mg | ORAL_TABLET | Freq: Once | ORAL | Status: DC

## 2013-01-19 MED ORDER — AMOXICILLIN-POT CLAVULANATE 875-125 MG PO TABS: 1.0000 | ORAL_TABLET | Freq: Two times a day (BID) | ORAL | Status: DC

## 2013-01-19 NOTE — Patient Instructions (Addendum)
Strep Throat  Strep throat is an infection of the throat caused by a bacteria named Streptococcus pyogenes. Your caregiver may call the infection streptococcal "tonsillitis" or "pharyngitis" depending on whether there are signs of inflammation in the tonsils or back of the throat. Strep throat is most common in children aged 43 15 years during the cold months of the year, but it can occur in people of any age during any season. This infection is spread from person to person (contagious) through coughing, sneezing, or other close contact.  SYMPTOMS   · Fever or chills.  · Painful, swollen, red tonsils or throat.  · Pain or difficulty when swallowing.  · White or yellow spots on the tonsils or throat.  · Swollen, tender lymph nodes or "glands" of the neck or under the jaw.  · Red rash all over the body (rare).  DIAGNOSIS   Many different infections can cause the same symptoms. A test must be done to confirm the diagnosis so the right treatment can be given. A "rapid strep test" can help your caregiver make the diagnosis in a few minutes. If this test is not available, a light swab of the infected area can be used for a throat culture test. If a throat culture test is done, results are usually available in a day or two.  TREATMENT   Strep throat is treated with antibiotic medicine.  HOME CARE INSTRUCTIONS   · Gargle with 1 tsp of salt in 1 cup of warm water, 3 4 times per day or as needed for comfort.  · Family members who also have a sore throat or fever should be tested for strep throat and treated with antibiotics if they have the strep infection.  · Make sure everyone in your household washes their hands well.  · Do not share food, drinking cups, or personal items that could cause the infection to spread to others.  · You may need to eat a soft food diet until your sore throat gets better.  · Drink enough water and fluids to keep your urine clear or pale yellow. This will help prevent dehydration.  · Get plenty of  rest.  · Stay home from school, daycare, or work until you have been on antibiotics for 24 hours.  · Only take over-the-counter or prescription medicines for pain, discomfort, or fever as directed by your caregiver.  · If antibiotics are prescribed, take them as directed. Finish them even if you start to feel better.  SEEK MEDICAL CARE IF:   · The glands in your neck continue to enlarge.  · You develop a rash, cough, or earache.  · You cough up green, yellow-brown, or bloody sputum.  · You have pain or discomfort not controlled by medicines.  · Your problems seem to be getting worse rather than better.  SEEK IMMEDIATE MEDICAL CARE IF:   · You develop any new symptoms such as vomiting, severe headache, stiff or painful neck, chest pain, shortness of breath, or trouble swallowing.  · You develop severe throat pain, drooling, or changes in your voice.  · You develop swelling of the neck, or the skin on the neck becomes red and tender.  · You have a fever.  · You develop signs of dehydration, such as fatigue, dry mouth, and decreased urination.  · You become increasingly sleepy, or you cannot wake up completely.  Document Released: 08/11/2000 Document Revised: 07/31/2012 Document Reviewed: 10/13/2010  [employer]® Patient Information ©2014 [employer].

## 2013-01-19 NOTE — Progress Notes (Signed)
[address]   [place], [place]  [address]   [phone]  Subjective:    Patient ID: Sherry [name], female    DOB: Mar 20, 1971, 42 y.o.   MRN: [id number]  HPI This 42 y.o. female presents for evaluation of sore throat, cold symptoms.    Onset two days ago.  Suffered with sinusitis for three weeks; treated with antibiotic/Amoxicillin and Prednisone with improvement.  Did not want to wait so long this time before being treated.  Onset with ear draining, sore throat.  No fever but +chills/sweats.  Mild headache.  +exhausted/fatigue/malaise.  +ST L>R; +pain with swallowing.  No drooling or spitting; no pain with talking.  Mild rhinorrhea.  +PND.  Taking Mucinex D.  Mild cough.  No SOB.  No tobacco now; quit smoking two weeks ago.  No vomiting but mild diarrhea.  No sick contacts other than coworkers with similar symptoms.  +Bad allergies.  Finished antibiotic last week; felt really great until medication ended.  Prescribed Amoxicillin and Prednisone.  Taking Allegra, Flonase.  PCP: Elpidio Anis, PA-C   Review of Systems  Constitutional: Positive for chills and fatigue. Negative for fever and diaphoresis.  HENT: Positive for congestion, sore throat, rhinorrhea, trouble swallowing, voice change, postnasal drip and ear discharge. Negative for hearing loss, ear pain, sneezing and sinus pressure.   Respiratory: Positive for cough. Negative for shortness of breath and wheezing.   Gastrointestinal: Negative for nausea and vomiting.  Skin: Negative for rash.  Neurological: Positive for headaches. Negative for dizziness and light-headedness.        Past Medical History  Diagnosis Date  . Arthritis   . Asthma   . Kidney stones   . GERD (gastroesophageal reflux disease)   . Pityriasis rosea   . Costochondritis   . Back pain   . Allergy   . Depression     Past Surgical History  Procedure Laterality Date  . Abdominal hysterectomy    . Leep    . Tonsillectomy    . Wisdom tooth extraction      . Laparoscopy    . Kidney stones      Prior to Admission medications   Medication Sig Start Date End Date Taking? Authorizing Provider  ALPRAZolam Prudy Feeler) 0.5 MG tablet Take 1 tablet (0.5 mg total) by mouth 2 (two) times daily as needed for sleep. Needs office visit (second notice) 04/04/12  Yes Heather M Marte, PA-C  fexofenadine-pseudoephedrine (ALLEGRA-D) 60-120 MG per tablet Take 1 tablet by mouth 2 (two) times daily.   Yes Historical Provider, MD  FLUoxetine (PROZAC) 40 MG capsule Take 40 mg by mouth daily.     Yes Historical Provider, MD  HYDROcodone-acetaminophen (NORCO) 10-325 MG per tablet Take 1-2 tablets by mouth every 8 (eight) hours as needed. For pain     Yes Historical Provider, MD  ibuprofen (ADVIL,MOTRIN) 800 MG tablet Take 800 mg by mouth as needed for pain.   Yes Historical Provider, MD  methocarbamol (ROBAXIN) 500 MG tablet Take 500 mg by mouth as needed.   Yes Historical Provider, MD  ALPRAZolam Prudy Feeler) 0.5 MG tablet take 1 tablet by mouth twice a day if needed 04/04/12   Nelva Nay, PA-C  ALPRAZolam Prudy Feeler) 0.5 MG tablet take 1 tablet by mouth twice a day if needed for sleep 07/03/12   Eleanore E Egan, PA-C  loratadine-pseudoephedrine (CLARITIN-D 12 HOUR) 5-120 MG per tablet Take 1 tablet by mouth daily.      Historical Provider, MD  PRESCRIPTION  MEDICATION Apply 1 application topically daily as needed. Psoriasis cream     Historical Provider, MD  ranitidine (ZANTAC) 150 MG tablet Take 150 mg by mouth 2 (two) times daily as needed. For reflux     Historical Provider, MD    Allergies  Allergen Reactions  . Latex Hives  . Iohexol Hives and Itching     Code: HIVES, Desc: Pt broke out with several hives and had itchy eyes. We gave her 50 mg benadryl PO.   Will need benadryl prior to IV contrast in the future.  Thanks, J. Patrick North, Onset Date: [id number]     History   Social History  . Marital Status: Single    Spouse Name: N/A    Number of Children: N/A  . Years  of Education: N/A   Occupational History  . Not on file.   Social History Main Topics  . Smoking status: Former Games developer  . Smokeless tobacco: Not on file  . Alcohol Use: Yes     Comment: occasional  . Drug Use: No  . Sexually Active: Yes    Birth Control/ Protection: Surgical, Condom   Other Topics Concern  . Not on file   Social History Narrative  . No narrative on file    Family History  Problem Relation Age of Onset  . Hypertension Mother     Objective:   Physical Exam  Nursing note and vitals reviewed. Constitutional: She appears well-developed and well-nourished. No distress.  HENT:  Head: Normocephalic and atraumatic.  Left Ear: External ear normal.  Nose: Nose normal.  Mouth/Throat: Mucous membranes are normal. Posterior oropharyngeal erythema present. No oropharyngeal exudate, posterior oropharyngeal edema or tonsillar abscesses.  Eyes: Conjunctivae and EOM are normal. Pupils are equal, round, and reactive to light.  Neck: Normal range of motion. Neck supple. No thyromegaly present.  Cardiovascular: Normal rate, regular rhythm and normal heart sounds.   Pulmonary/Chest: Effort normal and breath sounds normal.  Lymphadenopathy:    She has cervical adenopathy.  Skin: No rash noted. She is not diaphoretic.  Psychiatric: She has a normal mood and affect. Her behavior is normal.       Results for orders placed in visit on 01/19/13  POCT RAPID STREP A (OFFICE)      Result Value Range   Rapid Strep A Screen Negative  Negative    Assessment & Plan:  Acute pharyngitis - Plan: POCT rapid strep A, Culture, Group A Strep, amoxicillin-clavulanate (AUGMENTIN) 875-125 MG per tablet, fluconazole (DIFLUCAN) 150 MG tablet  Allergic rhinitis, cause unspecified   1.  Acute Pharyngitis:  New.  Send throat culture.  Treat supportively with rest, fluids, Ibuprofen.  Rx for Augmentin provided while awaiting throat culture results. RTC inability to swallow.  Recent sinusitis  with recurrent symptoms with cessation of antibiotics; will treat with Augmentin to treat persistent sinusitis. 2.  Allergic Rhinitis: worsening/chronic; continue Allegra-D and Flonase.   Meds ordered this encounter  Medications  . fexofenadine-pseudoephedrine (ALLEGRA-D) 60-120 MG per tablet    Sig: Take 1 tablet by mouth 2 (two) times daily.  . methocarbamol (ROBAXIN) 500 MG tablet    Sig: Take 500 mg by mouth as needed.  Marland Kitchen ibuprofen (ADVIL,MOTRIN) 800 MG tablet    Sig: Take 800 mg by mouth as needed for pain.  Marland Kitchen amoxicillin-clavulanate (AUGMENTIN) 875-125 MG per tablet    Sig: Take 1 tablet by mouth 2 (two) times daily.    Dispense:  20 tablet    Refill:  0  .  fluconazole (DIFLUCAN) 150 MG tablet    Sig: Take 1 tablet (150 mg total) by mouth once. Repeat if needed    Dispense:  3 tablet    Refill:  0

## 2013-01-21 LAB — CULTURE, GROUP A STREP: Organism ID, Bacteria: NORMAL

## 2013-05-08 ENCOUNTER — Other Ambulatory Visit: Payer: Self-pay

## 2013-05-08 DIAGNOSIS — Z1231 Encounter for screening mammogram for malignant neoplasm of breast: Secondary | ICD-10-CM

## 2013-05-28 ENCOUNTER — Ambulatory Visit
Admission: RE | Admit: 2013-05-28 | Discharge: 2013-05-28 | Disposition: A | Discharge status: Home / Self Care | Payer: BC Managed Care – PPO | Source: Ambulatory Visit

## 2013-05-28 DIAGNOSIS — Z1231 Encounter for screening mammogram for malignant neoplasm of breast: Secondary | ICD-10-CM

## 2013-11-03 ENCOUNTER — Ambulatory Visit (INDEPENDENT_AMBULATORY_CARE_PROVIDER_SITE_OTHER): Payer: BC Managed Care – PPO | Admitting: Physician Assistant

## 2013-11-03 VITALS — BP 92/68 | HR 77 | Temp 98.0°F | Resp 16 | Ht 67.0 in | Wt 214.0 lb

## 2013-11-03 DIAGNOSIS — L282 Other prurigo: Secondary | ICD-10-CM

## 2013-11-03 DIAGNOSIS — T7840XA Allergy, unspecified, initial encounter: Secondary | ICD-10-CM

## 2013-11-03 MED ORDER — METHYLPREDNISOLONE SODIUM SUCC 125 MG IJ SOLR
125.0000 mg | Freq: Once | INTRAMUSCULAR | Status: AC
  Administered 2013-11-03: 125 mg via INTRAMUSCULAR

## 2013-11-03 MED ORDER — HYDROXYZINE HCL 50 MG PO TABS: 50.0000 mg | ORAL_TABLET | Freq: Three times a day (TID) | ORAL | Status: DC | PRN

## 2013-11-03 MED ORDER — RANITIDINE HCL 150 MG PO TABS: 150.0000 mg | ORAL_TABLET | Freq: Two times a day (BID) | ORAL | Status: DC | PRN

## 2013-11-03 MED ORDER — EPINEPHRINE 0.3 MG/0.3ML IJ SOAJ: 0.3000 mg | Freq: Once | INTRAMUSCULAR | Status: AC

## 2013-11-03 MED ORDER — PREDNISONE 20 MG PO TABS: ORAL_TABLET | ORAL | Status: DC

## 2013-11-03 NOTE — Progress Notes (Signed)
Subjective:    Patient ID: Sherry [name], female    DOB: February 12, 1971, 43 y.o.   MRN: [id number]  HPI 43 year old female presents for evaluation of allergic reaction x 2 days.  Complains of pruritic rash on left arm and left foot.  States her arm started with 2 areas that looked like insect bite/sting that has now developed diffuse swelling, warmth, and pruritis.  Has 3 small erythematous areas on her left foot. Admits to similar reaction about 1 month ago - no known sting or insect bite. Left arm has been worsening for 2 days, foot rash just started this morning.  Denies SOB, wheezing, coughing, lip/tongue swelling, or trouble breathing.   Lives at home with 2 dogs who are asymptomatic. She has looked for bed bugs, fleas, and bees but has not seen anything in her house. The episode that occurred 1 month ago happened while she was at work.    She has been taking Benadryl 25 mg tid as well as Allegra-D - neither have been helping at all.  Has used ice and heat.    Hx of allergies to "everything." Had allergy testing when she was 43 years old and was on allergy shots for "a while."    Patient is otherwise doing well with no other concerns today.     Review of Systems  HENT: Negative for trouble swallowing.   Cardiovascular: Negative for chest pain and palpitations.  Musculoskeletal: Negative for arthralgias.  Skin: Positive for color change and rash.  Neurological: Negative for dizziness and syncope.       Objective:   Physical Exam  Constitutional: She is oriented to person, place, and time. She appears well-developed and well-nourished.  HENT:  Head: Normocephalic and atraumatic.  Right Ear: External ear normal.  Left Ear: External ear normal.  Mouth/Throat: Uvula is midline, oropharynx is clear and moist and mucous membranes are normal.  Eyes: Conjunctivae are normal.  Neck: Normal range of motion.  Cardiovascular: Normal rate, regular rhythm and normal heart sounds.     Pulmonary/Chest: Effort normal and breath sounds normal.  Neurological: She is alert and oriented to person, place, and time.  Skin:     Noted area on arm has large, erythematous wheal with significant STS and warmth. No induration or fluctuance. On posterior upper arm there are 2 small fluid filled vesicles.    On foot there are 3 circular erythematous lesions. No swelling, warmth, or pain.   Psychiatric: She has a normal mood and affect. Her behavior is normal. Judgment and thought content normal.    Solumedrol 125 mg IM given today.       Assessment & Plan:  Allergic reaction - Plan: EPINEPHrine (EPIPEN 2-PAK) 0.3 mg/0.3 mL SOAJ injection, hydrOXYzine (ATARAX/VISTARIL) 50 MG tablet, ranitidine (ZANTAC) 150 MG tablet, predniSONE (DELTASONE) 20 MG tablet, methylPREDNISolone sodium succinate (SOLU-MEDROL) 125 mg/2 mL injection 125 mg  Pruritic rash - Plan: EPINEPHrine (EPIPEN 2-PAK) 0.3 mg/0.3 mL SOAJ injection, hydrOXYzine (ATARAX/VISTARIL) 50 MG tablet, ranitidine (ZANTAC) 150 MG tablet, predniSONE (DELTASONE) 20 MG tablet  Allergic reaction - appears to be insect sting related but unsure of etiology. Is not contact or drug rxn.   Will treat with prednisone taper over 9 days Continue Allegra daily. Add Zantac 150 mg bid and Atarax 50 mg at bedtime Epipen to have at home.   RTC precautions discussed. Unlikely will progress to anaphylaxis since symptoms have been present for 2 days. Patient understands to return or go to ER if  she develops SOB, lip/tongue swelling, or trouble breathing.  Referral to allergist for evaluation.

## 2014-01-18 ENCOUNTER — Other Ambulatory Visit: Payer: Self-pay | Admitting: Physician Assistant

## 2014-03-03 ENCOUNTER — Encounter (HOSPITAL_COMMUNITY): Payer: Self-pay | Admitting: Emergency Medicine

## 2014-03-03 ENCOUNTER — Emergency Department (HOSPITAL_COMMUNITY)
Admission: EM | Admit: 2014-03-03 | Discharge: 2014-03-04 | Disposition: A | Discharge status: Home / Self Care | Payer: BC Managed Care – PPO | Attending: Emergency Medicine | Admitting: Emergency Medicine

## 2014-03-03 DIAGNOSIS — Z7982 Long term (current) use of aspirin: Secondary | ICD-10-CM | POA: Insufficient documentation

## 2014-03-03 DIAGNOSIS — Z91040 Latex allergy status: Secondary | ICD-10-CM | POA: Insufficient documentation

## 2014-03-03 DIAGNOSIS — Z87891 Personal history of nicotine dependence: Secondary | ICD-10-CM | POA: Insufficient documentation

## 2014-03-03 DIAGNOSIS — M129 Arthropathy, unspecified: Secondary | ICD-10-CM | POA: Insufficient documentation

## 2014-03-03 DIAGNOSIS — K219 Gastro-esophageal reflux disease without esophagitis: Secondary | ICD-10-CM | POA: Insufficient documentation

## 2014-03-03 DIAGNOSIS — R0789 Other chest pain: Secondary | ICD-10-CM

## 2014-03-03 DIAGNOSIS — F3289 Other specified depressive episodes: Secondary | ICD-10-CM | POA: Insufficient documentation

## 2014-03-03 DIAGNOSIS — Z87442 Personal history of urinary calculi: Secondary | ICD-10-CM | POA: Insufficient documentation

## 2014-03-03 DIAGNOSIS — J45909 Unspecified asthma, uncomplicated: Secondary | ICD-10-CM | POA: Insufficient documentation

## 2014-03-03 DIAGNOSIS — F329 Major depressive disorder, single episode, unspecified: Secondary | ICD-10-CM | POA: Insufficient documentation

## 2014-03-03 DIAGNOSIS — M940 Chondrocostal junction syndrome [Tietze]: Secondary | ICD-10-CM | POA: Insufficient documentation

## 2014-03-03 DIAGNOSIS — Z79899 Other long term (current) drug therapy: Secondary | ICD-10-CM | POA: Insufficient documentation

## 2014-03-03 NOTE — ED Notes (Addendum)
Pt presents to ED with c/o central chest pain.  Pt reports that she has been having chest pain since yesterday, also c/o some nausea and dizziness "that is not bad".  Pt reports that she took Nitro SL x 1 at 2300 but without any effect.  Pt also took Hydrocodone, Ibuprofen, Aspirin and Zantac and Prilosec but they did not helped at all; also took a Robaxin but still no relief.

## 2014-03-03 NOTE — ED Notes (Signed)
EKG given to EDP,James, MD., for review. 

## 2014-03-04 ENCOUNTER — Emergency Department (HOSPITAL_COMMUNITY): Payer: BC Managed Care – PPO

## 2014-03-04 LAB — BASIC METABOLIC PANEL
Anion gap: 16 — ABNORMAL HIGH (ref 5–15)
BUN: 11 mg/dL (ref 6–23)
CO2: 21 mEq/L (ref 19–32)
Calcium: 9.3 mg/dL (ref 8.4–10.5)
Chloride: 100 mEq/L (ref 96–112)
Creatinine, Ser: 0.62 mg/dL (ref 0.50–1.10)
GFR calc Af Amer: >90 mL/min (ref >90)
Glucose, Bld: 99 mg/dL (ref 70–99)
Potassium: 3.9 mEq/L (ref 3.7–5.3)
SODIUM: 137 meq/L (ref 137–147)

## 2014-03-04 LAB — CBC
HCT: 37.6 % (ref 36.0–46.0)
Hemoglobin: 13 g/dL (ref 12.0–15.0)
MCH: 30.4 pg (ref 26.0–34.0)
MCHC: 34.6 g/dL (ref 30.0–36.0)
MCV: 87.9 fL (ref 78.0–100.0)
Platelets: 460 10*3/uL — ABNORMAL HIGH (ref 150–400)
RBC: 4.28 MIL/uL (ref 3.87–5.11)
RDW: 12.5 % (ref 11.5–15.5)
WBC: 11.2 10*3/uL — ABNORMAL HIGH (ref 4.0–10.5)

## 2014-03-04 LAB — I-STAT TROPONIN, ED: TROPONIN I, POC: 0 ng/mL (ref 0.00–0.08)

## 2014-03-04 MED ORDER — GI COCKTAIL ~~LOC~~
30.0000 mL | Freq: Once | ORAL | Status: AC
  Administered 2014-03-04: 30 mL via ORAL
  Filled 2014-03-04: qty 30

## 2014-03-04 MED ORDER — MORPHINE SULFATE 4 MG/ML IJ SOLN
4.0000 mg | Freq: Once | INTRAMUSCULAR | Status: AC
  Administered 2014-03-04: 4 mg via INTRAVENOUS
  Filled 2014-03-04: qty 1

## 2014-03-04 NOTE — ED Provider Notes (Signed)
CSN: [id number]     Arrival date & time 03/03/14  2334 History   First MD Initiated Contact with Patient 03/04/14 0048     Chief Complaint  Patient presents with  . Chest Pain   HPI  History provided by the patient. The patient is a 43 year old female with a history of asthma, GERD, costochondritis, back pain who presents with complaints of chest pain. Patient reports having persistent chest pain and discomfort first began yesterday. She reports having heaviness and pressure that is generally in the center and right side of her chest although sometimes it expands into the left side. It also can radiate and fill a tightness into the right neck and shoulder blade. Symptoms seem improved when she lays back flat and are worse when she is standing or sitting upright. There is no significant change with movements of the arms or body otherwise. No increased pain with breathing. No associated shortness of breath. There is slight nausea at times but no vomiting. Patient did take her normal medications for GERD yesterday without much change. She also took 800 mg ibuprofen which normally treats her costochondritis without improvement. Patient was also given one SL nitro from her mother which may have helped some with pain. No increased pain with activity. Symptoms are the same whether at rest or moving.   Past Medical History  Diagnosis Date  . Arthritis   . Asthma   . Kidney stones   . GERD (gastroesophageal reflux disease)   . Pityriasis rosea   . Costochondritis   . Back pain   . Allergy   . Depression    Past Surgical History  Procedure Laterality Date  . Abdominal hysterectomy    . Leep    . Tonsillectomy    . Wisdom tooth extraction    . Laparoscopy    . Kidney stones     Family History  Problem Relation Age of Onset  . Hypertension Mother    History  Substance Use Topics  . Smoking status: Former Games developer  . Smokeless tobacco: Not on file  . Alcohol Use: Yes     Comment: occasional    OB History   Grav Para Term Preterm Abortions TAB SAB Ect Mult Living                 Review of Systems  Respiratory: Negative for shortness of breath.   Cardiovascular: Positive for chest pain.  All other systems reviewed and are negative.     Allergies  Latex and Iohexol  Home Medications   Prior to Admission medications   Medication Sig Start Date End Date Taking? Authorizing Provider  ALPRAZolam Prudy Feeler) 0.5 MG tablet Take 1 tablet (0.5 mg total) by mouth 2 (two) times daily as needed for sleep. Needs office visit (second notice) 04/04/12  Yes Nelva Nay, PA-C  aspirin 325 MG tablet Take 325 mg by mouth daily as needed.   Yes Historical Provider, MD  fexofenadine-pseudoephedrine (ALLEGRA-D) 60-120 MG per tablet Take 1 tablet by mouth every morning.    Yes Historical Provider, MD  FLUoxetine (PROZAC) 40 MG capsule Take 40 mg by mouth daily.     Yes Historical Provider, MD  HYDROcodone-acetaminophen (NORCO) 10-325 MG per tablet Take 1-2 tablets by mouth every 8 (eight) hours as needed. For pain     Yes Historical Provider, MD  ibuprofen (ADVIL,MOTRIN) 800 MG tablet Take 800 mg by mouth as needed for pain.   Yes Historical Provider, MD  methocarbamol (ROBAXIN) 500  MG tablet Take 500 mg by mouth every 8 (eight) hours as needed for muscle spasms.    Yes Historical Provider, MD  nitroGLYCERIN (NITROSTAT) 0.4 MG SL tablet Place 0.4 mg under the tongue once.   Yes Historical Provider, MD  omeprazole (PRILOSEC) 20 MG capsule Take 20 mg by mouth daily as needed (indigestion).   Yes Historical Provider, MD  ranitidine (ZANTAC) 150 MG tablet take 1 tablet by mouth twice a day if needed for ACID REFLUX 01/18/14  Yes Eleanore E Egan, PA-C  EPINEPHrine (EPIPEN 2-PAK) 0.3 mg/0.3 mL SOAJ injection Inject 0.3 mLs (0.3 mg total) into the muscle once. 11/03/13   Heather M Marte, PA-C   BP 155/90  Pulse 78  Temp(Src) 98.6 F (37 C) (Oral)  Resp 18  SpO2 99% Physical Exam  Nursing note and  vitals reviewed. Constitutional: She is oriented to person, place, and time. She appears well-developed and well-nourished. No distress.  HENT:  Head: Normocephalic.  Cardiovascular: Normal rate and regular rhythm.   Pulmonary/Chest: Effort normal and breath sounds normal. No respiratory distress. She has no wheezes. She has no rales. She exhibits no tenderness.  Abdominal: Soft. She exhibits no distension and no mass. There is no tenderness. There is no rebound and no guarding.  Musculoskeletal: Normal range of motion. She exhibits no edema and no tenderness.  No clinical signs concerning for DVT  Neurological: She is alert and oriented to person, place, and time.  Skin: Skin is warm and dry. No rash noted.  Psychiatric: She has a normal mood and affect. Her behavior is normal.    ED Course  Procedures   COORDINATION OF CARE:  Nursing notes reviewed. Vital signs reviewed. Initial pt interview and examination performed.   Filed Vitals:   03/03/14 2344  BP: 155/90  Pulse: 78  Temp: 98.6 F (37 C)  TempSrc: Oral  Resp: 18  SpO2: 99%    1:17 AM-patient seen and evaluated. Patient appears comfortable. Does report some improvements while resting. She is PERC negative. She has no prior hx of HTN, DM, hypercholesterolemia. Mother did recently have MI in her 6270's. no significant family history.    patient with unremarkable ECG, troponin and chest x-ray. No other concerning findings on laboratory tests. Symptoms have been persistent for the past 48 hours. This time I think patient is low risk for ACS. She's been instructed to follow up with her PCP for continued evaluation and treatment. Strict return precautions given.   Treatment plan initiated: Medications  morphine 4 MG/ML injection 4 mg (not administered)  gi cocktail (Maalox,Lidocaine,Donnatal) (not administered)    Results for orders placed during the hospital encounter of 03/03/14  CBC      Result Value Ref Range   WBC  11.2 (*) 4.0 - 10.5 K/uL   RBC 4.28  3.87 - 5.11 MIL/uL   Hemoglobin 13.0  12.0 - 15.0 g/dL   HCT 09.837.6  11.936.0 - 14.746.0 %   MCV 87.9  78.0 - 100.0 fL   MCH 30.4  26.0 - 34.0 pg   MCHC 34.6  30.0 - 36.0 g/dL   RDW 82.912.5  56.211.5 - 13.015.5 %   Platelets 460 (*) 150 - 400 K/uL  BASIC METABOLIC PANEL      Result Value Ref Range   Sodium 137  137 - 147 mEq/L   Potassium 3.9  3.7 - 5.3 mEq/L   Chloride 100  96 - 112 mEq/L   CO2 21  19 - 32 mEq/L  Glucose, Bld 99  70 - 99 mg/dL   BUN 11  6 - 23 mg/dL   Creatinine, Ser 1.610.62  0.50 - 1.10 mg/dL   Calcium 9.3  8.4 - 09.610.5 mg/dL   GFR calc non Af Amer >90  >90 mL/min   GFR calc Af Amer >90  >90 mL/min   Anion gap 16 (*) 5 - 15  I-STAT TROPOININ, ED      Result Value Ref Range   Troponin i, poc 0.00  0.00 - 0.08 ng/mL   Comment 3               Imaging Review Dg Chest Port 1 View  03/04/2014   CLINICAL DATA:  Sudden onset chest pain  EXAM: PORTABLE CHEST - 1 VIEW  COMPARISON:  Prior radiograph from 02/21/2010  FINDINGS: The cardiac and mediastinal silhouettes are stable in size and contour, and remain within normal limits.  The lungs are normally inflated. Mild diffuse peribronchial thickening is present, similar to prior. No airspace consolidation, pleural effusion, or pulmonary edema is identified. There is no pneumothorax.  No acute osseous abnormality identified.  IMPRESSION: 1. No acute cardiopulmonary abnormality. 2. Mild diffuse peribronchial thickening, similar to prior.   Electronically Signed   By: Rise MuBenjamin  McClintock M.D.   On: 03/04/2014 01:23     EKG Interpretation None      Date: 03/04/2014  Rate: 79  Rhythm: normal sinus rhythm  QRS Axis: normal  Intervals: normal  ST/T Wave abnormalities: normal  Conduction Disutrbances:none  Narrative Interpretation:   Old EKG Reviewed: none available      MDM   Final diagnoses:  Atypical chest pain        Angus Sellereter S Gwenette Wellons, PA-C 03/04/14 0403

## 2014-03-04 NOTE — Discharge Instructions (Signed)
Your laboratory testing, EKG of your heart and chest x-ray have not shown any signs for a concerning or any emergent cause of your symptoms at this time. Please followup with your primary care provider tomorrow for continued evaluation and treatment. Return for any changing or worsening symptoms.    Chest Pain (Nonspecific) It is often hard to give a specific diagnosis for the cause of chest pain. There is always a chance that your pain could be related to something serious, such as a heart attack or a blood clot in the lungs. You need to follow up with your health care provider for further evaluation. CAUSES   Heartburn.  Pneumonia or bronchitis.  Anxiety or stress.  Inflammation around your heart (pericarditis) or lung (pleuritis or pleurisy).  A blood clot in the lung.  A collapsed lung (pneumothorax). It can develop suddenly on its own (spontaneous pneumothorax) or from trauma to the chest.  Shingles infection (herpes zoster virus). The chest wall is composed of bones, muscles, and cartilage. Any of these can be the source of the pain.  The bones can be bruised by injury.  The muscles or cartilage can be strained by coughing or overwork.  The cartilage can be affected by inflammation and become sore (costochondritis). DIAGNOSIS  Lab tests or other studies may be needed to find the cause of your pain. Your health care provider may have you take a test called an ambulatory electrocardiogram (ECG). An ECG records your heartbeat patterns over a 24-hour period. You may also have other tests, such as:  Transthoracic echocardiogram (TTE). During echocardiography, sound waves are used to evaluate how blood flows through your heart.  Transesophageal echocardiogram (TEE).  Cardiac monitoring. This allows your health care provider to monitor your heart rate and rhythm in real time.  Holter monitor. This is a portable device that records your heartbeat and can help diagnose heart  arrhythmias. It allows your health care provider to track your heart activity for several days, if needed.  Stress tests by exercise or by giving medicine that makes the heart beat faster. TREATMENT   Treatment depends on what may be causing your chest pain. Treatment may include:  Acid blockers for heartburn.  Anti-inflammatory medicine.  Pain medicine for inflammatory conditions.  Antibiotics if an infection is present.  You may be advised to change lifestyle habits. This includes stopping smoking and avoiding alcohol, caffeine, and chocolate.  You may be advised to keep your head raised (elevated) when sleeping. This reduces the chance of acid going backward from your stomach into your esophagus. Most of the time, nonspecific chest pain will improve within 2-3 days with rest and mild pain medicine.  HOME CARE INSTRUCTIONS   If antibiotics were prescribed, take them as directed. Finish them even if you start to feel better.  For the next few days, avoid physical activities that bring on chest pain. Continue physical activities as directed.  Do not use any tobacco products, including cigarettes, chewing tobacco, or electronic cigarettes.  Avoid drinking alcohol.  Only take medicine as directed by your health care provider.  Follow your health care provider's suggestions for further testing if your chest pain does not go away.  Keep any follow-up appointments you made. If you do not go to an appointment, you could develop lasting (chronic) problems with pain. If there is any problem keeping an appointment, call to reschedule. SEEK MEDICAL CARE IF:   Your chest pain does not go away, even after treatment.  You have  a rash with blisters on your chest.  You have a fever. SEEK IMMEDIATE MEDICAL CARE IF:   You have increased chest pain or pain that spreads to your arm, neck, jaw, back, or abdomen.  You have shortness of breath.  You have an increasing cough, or you cough up  blood.  You have severe back or abdominal pain.  You feel nauseous or vomit.  You have severe weakness.  You faint.  You have chills. This is an emergency. Do not wait to see if the pain will go away. Get medical help at once. Call your local emergency services (911 in [place] Do not drive yourself to the hospital. MAKE SURE YOU:   Understand these instructions.  Will watch your condition.  Will get help right away if you are not doing well or get worse. Document Released: 05/24/2005 Document Revised: 08/19/2013 Document Reviewed: 03/19/2008 [facility] Patient Information 2015 [place], [place]. This information is not intended to replace advice given to you by your health care provider. Make sure you discuss any questions you have with your health care provider.

## 2014-03-07 NOTE — ED Provider Notes (Signed)
Medical screening examination/treatment/procedure(s) were performed by non-physician practitioner and as supervising physician I was immediately available for consultation/collaboration.   EKG Interpretation   Date/Time:  Tuesday March 03 2014 23:47:02 EDT Ventricular Rate:  79 PR Interval:  136 QRS Duration: 82 QT Interval:  418 QTC Calculation: 479 R Axis:   66 Text Interpretation:  Sinus rhythm ED PHYSICIAN INTERPRETATION AVAILABLE  IN [facility] Confirmed by TEST, Record ([id number]) on 03/05/2014 6:59:14  AM        Rolland PorterMark Noely Kuhnle, MD 03/07/14 [phone]

## 2014-06-20 ENCOUNTER — Ambulatory Visit (INDEPENDENT_AMBULATORY_CARE_PROVIDER_SITE_OTHER): Payer: 59

## 2014-06-20 ENCOUNTER — Ambulatory Visit (INDEPENDENT_AMBULATORY_CARE_PROVIDER_SITE_OTHER): Payer: 59 | Admitting: Family Medicine

## 2014-06-20 VITALS — BP 124/82 | HR 88 | Temp 98.5°F | Resp 18 | Ht 66.75 in | Wt 210.0 lb

## 2014-06-20 DIAGNOSIS — M25552 Pain in left hip: Secondary | ICD-10-CM

## 2014-06-20 MED ORDER — PREDNISONE 20 MG PO TABS: 40.0000 mg | ORAL_TABLET | Freq: Every day | ORAL | Status: DC

## 2014-06-20 NOTE — Patient Instructions (Signed)
Take to prednisone today and 2 more tomorrow. If the pain is still as bad as it is today, call me and we will arrange an orthopedic referral

## 2014-06-20 NOTE — Progress Notes (Signed)
Patient ID: Sherry PallDeborah [name] MRN: [id number], DOB: [birth year], 43 y.o. Date of Encounter: 06/20/2014, 4:53 PM  This chart was scribed for Dr. Elvina SidleKurt Christyl Osentoski, MD by Jarvis Morganaylor Ferguson, Medical Scribe. This patient was seen in Room 14 and the patient's care was started at 4:31 PM.  Primary Physician: Verlon AuBoyd, Tammy Lamonica, MD  Chief Complaint:  Chief Complaint  Patient presents with  . Hip Pain    Left hip pain. Started last week. Pt. has tried heating pads, OTC meds with no relief. Dull pain right now but can become sharp.      HPI: 43 y.o. year old female with history below presents with constant, moderate left hip pain for one week. She states that the pain waxes and wanes between dull and sharp. The pain radiates all the way down her leg and causes throbbing pain in her left calf. Has associated numbness in her left thigh and calf. Denies any numbness or pain in the right. She notes that the pain is exacerbated by movement and walking. Pt states she will walk a few steps and her left hip will lock up and she has to stop and take a break. Pt sits all day at her job and she says that makes the pain worse as well. She previously fell on her porch and hurt her back but denies any injury to her left hip. She has been taking applying heating pads and taking 800 mg Ibuprofen with no relief. She also takes 6 hydrocodone per day for her chronic back pain and that provides no relief for the hip pain. She denies any color change or swelling.   Goes to [facility] for mgmt of her chronic back pain  Works at [employer]   Past Medical History  Diagnosis Date  . Arthritis   . Asthma   . Kidney stones   . GERD (gastroesophageal reflux disease)   . Pityriasis rosea   . Costochondritis   . Back pain   . Allergy   . Depression      Home Meds: Prior to Admission medications   Medication Sig Start Date End Date Taking? Authorizing Provider  ALPRAZolam Prudy Feeler(XANAX) 0.5 MG tablet Take 1 tablet (0.5  mg total) by mouth 2 (two) times daily as needed for sleep. Needs office visit (second notice) 04/04/12  Yes Heather M Marte, PA-C  EPINEPHrine (EPIPEN 2-PAK) 0.3 mg/0.3 mL SOAJ injection Inject 0.3 mLs (0.3 mg total) into the muscle once. 11/03/13  Yes Heather M Marte, PA-C  fexofenadine-pseudoephedrine (ALLEGRA-D) 60-120 MG per tablet Take 1 tablet by mouth every morning.    Yes Historical Provider, MD  FLUoxetine (PROZAC) 40 MG capsule Take 40 mg by mouth daily.     Yes Historical Provider, MD  HYDROcodone-acetaminophen (NORCO) 10-325 MG per tablet Take 1-2 tablets by mouth every 8 (eight) hours as needed. For pain     Yes Historical Provider, MD  ibuprofen (ADVIL,MOTRIN) 800 MG tablet Take 800 mg by mouth as needed for pain.   Yes Historical Provider, MD  methocarbamol (ROBAXIN) 500 MG tablet Take 500 mg by mouth every 8 (eight) hours as needed for muscle spasms.    Yes Historical Provider, MD  omeprazole (PRILOSEC) 20 MG capsule Take 20 mg by mouth daily as needed (indigestion).   Yes Historical Provider, MD  ranitidine (ZANTAC) 150 MG tablet take 1 tablet by mouth twice a day if needed for ACID REFLUX 01/18/14  Yes Godfrey PickEleanore E Egan, PA-C  aspirin 325 MG tablet Take  325 mg by mouth daily as needed.    Historical Provider, MD  nitroGLYCERIN (NITROSTAT) 0.4 MG SL tablet Place 0.4 mg under the tongue once.    Historical Provider, MD    Allergies:  Allergies  Allergen Reactions  . Latex Hives  . Iohexol Hives and Itching     Code: HIVES, Desc: Pt broke out with several hives and had itchy eyes. We gave her 50 mg benadryl PO.   Will need benadryl prior to IV contrast in the future.  Thanks, J. Patrick NorthBohm, RTRCT, Onset Date: [id number]     History   Social History  . Marital Status: Single    Spouse Name: N/A    Number of Children: N/A  . Years of Education: N/A   Occupational History  . Not on file.   Social History Main Topics  . Smoking status: Former Games developermoker  . Smokeless tobacco: Not on file    . Alcohol Use: Yes     Comment: occasional  . Drug Use: No  . Sexual Activity: Yes    Birth Control/ Protection: Surgical, Condom   Other Topics Concern  . Not on file   Social History Narrative  . No narrative on file     Review of Systems: Constitutional: negative for chills, fever, night sweats, weight changes, or fatigue  HEENT: negative for vision changes, hearing loss, congestion, rhinorrhea, ST, epistaxis, or sinus pressure Cardiovascular: negative for chest pain or palpitations Respiratory: negative for hemoptysis, wheezing, shortness of breath, or cough Abdominal: negative for abdominal pain, nausea, vomiting, diarrhea, or constipation Dermatological: negative for rash or color change to hip area Neurologic: positive for numbness (left thigh and calf). negative for headache, dizziness, or syncope Musk: positive for arthralgias (left hip), myalgias (left calf), and gait problem (due to pain). Negative for swelling All other systems reviewed and are otherwise negative with the exception to those above and in the HPI.   Physical Exam: Blood pressure 124/82, pulse 88, temperature 98.5 F (36.9 C), temperature source Oral, resp. rate 18, height 5' 6.75" (1.695 m), weight 210 lb (95.255 kg), SpO2 97.00%., Body mass index is 33.15 kg/(m^2). General: Well developed, well nourished, in no acute distress. Head: Normocephalic, atraumatic, eyes without discharge, sclera non-icteric, nares are without discharge. Bilateral auditory canals clear, TM's are without perforation, pearly grey and translucent with reflective cone of light bilaterally. Oral cavity moist, posterior pharynx without exudate, erythema, peritonsillar abscess, or post nasal drip.  Neck: Supple. No thyromegaly. Full ROM. No lymphadenopathy. Lungs: Clear bilaterally to auscultation without wheezes, rales, or rhonchi. Breathing is unlabored. Heart: RRR with S1 S2. No murmurs, rubs, or gallops appreciated. Abdomen: Soft,  non-tender, non-distended with normoactive bowel sounds. No hepatomegaly. No rebound/guarding. No obvious abdominal masses. Msk:  Strength and tone normal for age. Positive femoral stretch test on the left. Extremities/Skin: Warm and dry. No clubbing or cyanosis. No edema. No rashes or suspicious lesions. Neuro: Alert and oriented X 3. Moves all extremities spontaneously. Gait is normal. CNII-XII grossly in tact. Psych:  Responds to questions appropriately with a normal affect.   [facility] reading (PRIMARY) by  Dr. Milus GlazierLauenstein Diagnostic imaging of left hip shows no acute findings Lumbar spine:  No acute findings either   ASSESSMENT AND PLAN:  43 y.o. year old female with   The encounter diagnosis was Hip pain, left.  Will use prednisone for 36 hours. Patient is not getting better then will get an orthopedic referral  Signed, Elvina SidleKurt Jannelle Notaro, MD 06/20/2014 4:53 PM  I personally performed the services described in this documentation, which was scribed in my presence. The recorded information has been reviewed and is accurate.

## 2014-06-22 ENCOUNTER — Telehealth: Payer: Self-pay

## 2014-06-22 DIAGNOSIS — M25552 Pain in left hip: Secondary | ICD-10-CM

## 2014-06-22 NOTE — Telephone Encounter (Signed)
Pt wanted to let you know that the prednisone is helping some, but is still in a lot of pain. Wants to know if we can go ahead and order a MRI or what the next step would be. Please advise.

## 2014-06-22 NOTE — Telephone Encounter (Signed)
Best next step is orthopedic referral.  Less expensive than MRI and more likely to yield diagnosis

## 2014-06-24 MED ORDER — PREDNISONE 20 MG PO TABS: 40.0000 mg | ORAL_TABLET | Freq: Every day | ORAL | Status: DC

## 2014-06-24 NOTE — Telephone Encounter (Signed)
Pt notified.  Wants to know if we can RF prednisone until pt's ortho appt on Tues 11/3.

## 2014-07-02 NOTE — Telephone Encounter (Signed)
Prednisone was RFed

## 2016-11-02 ENCOUNTER — Ambulatory Visit
Admission: RE | Admit: 2016-11-02 | Discharge: 2016-11-02 | Disposition: A | Discharge status: Home / Self Care | Payer: Federal, State, Local not specified - PPO | Source: Ambulatory Visit | Attending: Physical Medicine and Rehabilitation | Admitting: Physical Medicine and Rehabilitation

## 2016-11-02 ENCOUNTER — Other Ambulatory Visit: Payer: Self-pay | Admitting: Physical Medicine and Rehabilitation

## 2016-11-02 DIAGNOSIS — M25562 Pain in left knee: Secondary | ICD-10-CM

## 2017-01-16 ENCOUNTER — Emergency Department (HOSPITAL_BASED_OUTPATIENT_CLINIC_OR_DEPARTMENT_OTHER)
Admission: EM | Admit: 2017-01-16 | Discharge: 2017-01-16 | Disposition: A | Discharge status: Home / Self Care | Payer: Federal, State, Local not specified - PPO | Attending: Emergency Medicine | Admitting: Emergency Medicine

## 2017-01-16 ENCOUNTER — Emergency Department (HOSPITAL_BASED_OUTPATIENT_CLINIC_OR_DEPARTMENT_OTHER): Payer: Federal, State, Local not specified - PPO

## 2017-01-16 ENCOUNTER — Encounter (HOSPITAL_BASED_OUTPATIENT_CLINIC_OR_DEPARTMENT_OTHER): Payer: Self-pay | Admitting: *Deleted

## 2017-01-16 DIAGNOSIS — J189 Pneumonia, unspecified organism: Secondary | ICD-10-CM

## 2017-01-16 DIAGNOSIS — Z7982 Long term (current) use of aspirin: Secondary | ICD-10-CM | POA: Diagnosis not present

## 2017-01-16 DIAGNOSIS — Z79899 Other long term (current) drug therapy: Secondary | ICD-10-CM | POA: Diagnosis not present

## 2017-01-16 DIAGNOSIS — Z87891 Personal history of nicotine dependence: Secondary | ICD-10-CM | POA: Insufficient documentation

## 2017-01-16 DIAGNOSIS — R0789 Other chest pain: Secondary | ICD-10-CM | POA: Diagnosis present

## 2017-01-16 DIAGNOSIS — J181 Lobar pneumonia, unspecified organism: Secondary | ICD-10-CM | POA: Insufficient documentation

## 2017-01-16 LAB — BASIC METABOLIC PANEL
Anion gap: 7 (ref 5–15)
BUN: 17 mg/dL (ref 6–20)
CO2: 26 mmol/L (ref 22–32)
CREATININE: 0.68 mg/dL (ref 0.44–1.00)
Calcium: 9 mg/dL (ref 8.9–10.3)
Chloride: 102 mmol/L (ref 101–111)
GFR calc Af Amer: >60 mL/min (ref >60)
GLUCOSE: 99 mg/dL (ref 65–99)
Potassium: 3.7 mmol/L (ref 3.5–5.1)
Sodium: 135 mmol/L (ref 135–145)

## 2017-01-16 LAB — CBC
HEMATOCRIT: 35 % — AB (ref 36.0–46.0)
Hemoglobin: 11.8 g/dL — ABNORMAL LOW (ref 12.0–15.0)
MCH: 31.4 pg (ref 26.0–34.0)
MCHC: 33.7 g/dL (ref 30.0–36.0)
MCV: 93.1 fL (ref 78.0–100.0)
PLATELETS: 362 10*3/uL (ref 150–400)
RBC: 3.76 MIL/uL — ABNORMAL LOW (ref 3.87–5.11)
RDW: 13.1 % (ref 11.5–15.5)
WBC: 10.2 10*3/uL (ref 4.0–10.5)

## 2017-01-16 LAB — TROPONIN I: Troponin I: <0.03 ng/mL (ref <0.03)

## 2017-01-16 MED ORDER — LEVOFLOXACIN 500 MG PO TABS: 500.0000 mg | ORAL_TABLET | Freq: Every day | ORAL | 0 refills | Status: DC

## 2017-01-16 MED ORDER — LEVOFLOXACIN 500 MG PO TABS
500.0000 mg | ORAL_TABLET | Freq: Once | ORAL | Status: AC
  Administered 2017-01-16: 500 mg via ORAL
  Filled 2017-01-16: qty 1

## 2017-01-16 MED ORDER — ALBUTEROL SULFATE (2.5 MG/3ML) 0.083% IN NEBU
5.0000 mg | INHALATION_SOLUTION | Freq: Once | RESPIRATORY_TRACT | Status: AC
  Administered 2017-01-16: 5 mg via RESPIRATORY_TRACT
  Filled 2017-01-16: qty 6

## 2017-01-16 NOTE — Discharge Instructions (Signed)
Take levaquin daily for a week.   See your doctor  Return to ER if you have worse chest pain, fever, cough, trouble breathing.

## 2017-01-16 NOTE — ED Triage Notes (Signed)
She stopped smoking 2 weeks ago due to chest pain. Now she is having pain between her shoulder blades. No cough. Chest pain is tight with movement. Pressure like something is sitting on her chest all day today.

## 2017-01-16 NOTE — ED Provider Notes (Signed)
[facility] Provider Note   CSN: [id number] Arrival date & time: 01/16/17  2127  By signing my name below, I, Sherry [name], attest that this documentation has been prepared under the direction and in the presence of Sherry Pander, MD. Electronically Signed: Thelma [name], Scribe. 01/16/17. 10:27 PM.  History   Chief Complaint Chief Complaint  Patient presents with  . Chest Pain   The history is provided by the patient. No language interpreter was used.   HPI Comments: Sherry [name] is a 46 y.o. female with a PMHx of costochondritis who presents to the Emergency Department complaining of constant, gradually worsening chest pain since earlier today. She states she has been having these symptoms since three weeks so she stopped smoking electronic cigarettes, but they worsened tonight so she came to the ED. She describes her CP as a tightness and it feels like pressure. She has associated back pain. She states that it is progressively worse for several weeks so she made an appointment with PCP tomorrow. She has been having nonproductive cough as well. No recent travel or leg swelling. Pt notes her mother had an MI when she was 49 years old. She had nl stress test several years ago. Was diagnosed with costochondritis before but she states that this felt different.   Past Medical History:  Diagnosis Date  . Allergy   . Arthritis   . Asthma   . Back pain   . Costochondritis   . Depression   . GERD (gastroesophageal reflux disease)   . Kidney stones   . Pityriasis rosea     There are no active problems to display for this patient.   Past Surgical History:  Procedure Laterality Date  . ABDOMINAL HYSTERECTOMY    . kidney stones    . LAPAROSCOPY    . LEEP    . TONSILLECTOMY    . WISDOM TOOTH EXTRACTION      OB History    No data available       Home Medications    Prior to Admission medications   Medication Sig Start Date End Date Taking? Authorizing  Provider  ALPRAZolam Prudy Feeler) 0.5 MG tablet Take 1 tablet (0.5 mg total) by mouth 2 (two) times daily as needed for sleep. Needs office visit (second notice) 04/04/12  Yes Nelva Nay, PA-C  aspirin 325 MG tablet Take 325 mg by mouth daily as needed.   Yes [provider]  EPINEPHrine (EPIPEN 2-PAK) 0.3 mg/0.3 mL SOAJ injection Inject 0.3 mLs (0.3 mg total) into the muscle once. 11/03/13  Yes Marte, Heather M, PA-C  fexofenadine-pseudoephedrine (ALLEGRA-D) 60-120 MG per tablet Take 1 tablet by mouth every morning.    Yes [provider]  FLUoxetine (PROZAC) 40 MG capsule Take 40 mg by mouth daily.     Yes [provider]  ibuprofen (ADVIL,MOTRIN) 800 MG tablet Take 800 mg by mouth as needed for pain.   Yes [provider]  methocarbamol (ROBAXIN) 500 MG tablet Take 500 mg by mouth every 8 (eight) hours as needed for muscle spasms.    Yes [provider]  Oxycodone-Acetaminophen (PERCOCET PO) Take by mouth.   Yes [provider]  ranitidine (ZANTAC) 150 MG tablet take 1 tablet by mouth twice a day if needed for ACID REFLUX 01/18/14  Yes Elsie Stain E, PA-C  HYDROcodone-acetaminophen (NORCO) 10-325 MG per tablet Take 1-2 tablets by mouth every 8 (eight) hours as needed. For pain      [provider]  nitroGLYCERIN (NITROSTAT) 0.4 MG SL tablet Place 0.4 mg under the tongue once.    [provider]  omeprazole (PRILOSEC) 20 MG capsule Take 20 mg by mouth daily as needed (indigestion).    [provider]  predniSONE (DELTASONE) 20 MG tablet Take 2 tablets (40 mg total) by mouth daily. 06/24/14   Elvina Sidle, MD    Family History Family History  Problem Relation Age of Onset  . Hypertension Mother     Social History Social History  Substance Use Topics  . Smoking status: Former Smoker    Types: E-cigarettes  . Smokeless tobacco: Current User  . Alcohol use Yes     Comment: occasional     Allergies     Latex and Iohexol   Review of Systems Review of Systems  Cardiovascular: Positive for chest pain.  Musculoskeletal: Positive for back pain.  All other systems reviewed and are negative.    Physical Exam Updated Vital Signs BP 134/81   Pulse 69   Temp 98.3 F (36.8 C) (Oral)   Resp 20   Ht 5\' 7"  (1.702 m)   Wt 215 lb (97.5 kg)   SpO2 99%   BMI 33.67 kg/m   Physical Exam  Constitutional: She is oriented to person, place, and time. She appears well-developed and well-nourished.  HENT:  Head: Normocephalic and atraumatic.  Cardiovascular: Normal rate, regular rhythm and normal heart sounds.  Exam reveals no gallop and no friction rub.   No murmur heard. Pulmonary/Chest: Effort normal and breath sounds normal. No respiratory distress. She has no wheezes. She has no rales. She exhibits no tenderness.  Musculoskeletal:  No reproducible chest tenderness Mild diffuse para thoracic tenderness  Neurological: She is alert and oriented to person, place, and time.  Skin: Skin is warm and dry.  Psychiatric: She has a normal mood and affect.  Nursing note and vitals reviewed.    ED Treatments / Results  DIAGNOSTIC STUDIES: Oxygen Saturation is 99% on RA, normal by my interpretation.    COORDINATION OF CARE: 10:05 PM Discussed treatment plan with pt at bedside and pt agreed to plan. Labs (all labs ordered are listed, but only abnormal results are displayed) Labs Reviewed  BASIC METABOLIC PANEL  CBC  TROPONIN I  URINALYSIS, ROUTINE W REFLEX MICROSCOPIC    EKG  EKG Interpretation None       Radiology No results found.  Procedures Procedures (including critical care time)  Medications Ordered in ED Medications - No data to display   Initial Impression / Assessment and Plan / ED Course  I have reviewed the triage vital signs and the nursing notes.  Pertinent labs & imaging results that were available during my care of the patient were reviewed by me and  considered in my medical decision making (see chart for details).    Sherry [name] is a 46 y.o. female here with chest pressure, cough. Just quit smoking. Pressure for 3 weeks so trop x 1 sufficient. Has some coughing as well so consider bronchitis vs pneumonia. I doubt PE or dissection.   11:39 PM CXR showed RLL atelectasis. After 1 neb, mild R lower crackles now. WBC nl. Trop neg. I think likely early pneumonia vs bronchitis. Will dc home with levaquin. Will have her follow up with PCP.   Final Clinical Impressions(s) / ED Diagnoses   Final diagnoses:  None    New Prescriptions New Prescriptions   No medications on file  I personally performed the  services described in this documentation, which was scribed in my presence. The recorded information has been reviewed and is accurate.     Sherry PanderYao, Shneur [place] Hsienta, MD 01/16/17 [phone]

## 2017-01-16 NOTE — ED Notes (Signed)
ED Provider at bedside. 

## 2017-01-16 NOTE — ED Notes (Signed)
Reports using the vape e-cigarette. States this has been going on since may but this morning it was between the shoulder blades and as the day went on her chest started hurting really bad. Has a Dr. Appointment tomorrow. Mother has had a MI.

## 2017-01-16 NOTE — ED Notes (Signed)
Pt ambulatory to room, gait quick and steady. NAD. Putting gown on.

## 2017-01-16 NOTE — ED Notes (Signed)
Pt verbalizes understanding of d/c instructions and denies any further need at this time. 

## 2017-02-02 ENCOUNTER — Emergency Department (HOSPITAL_BASED_OUTPATIENT_CLINIC_OR_DEPARTMENT_OTHER): Payer: Federal, State, Local not specified - PPO

## 2017-02-02 ENCOUNTER — Emergency Department (HOSPITAL_BASED_OUTPATIENT_CLINIC_OR_DEPARTMENT_OTHER)
Admission: EM | Admit: 2017-02-02 | Discharge: 2017-02-02 | Disposition: A | Discharge status: Home / Self Care | Payer: Federal, State, Local not specified - PPO | Attending: Emergency Medicine | Admitting: Emergency Medicine

## 2017-02-02 ENCOUNTER — Other Ambulatory Visit: Payer: Self-pay

## 2017-02-02 ENCOUNTER — Encounter (HOSPITAL_BASED_OUTPATIENT_CLINIC_OR_DEPARTMENT_OTHER): Payer: Self-pay | Admitting: Emergency Medicine

## 2017-02-02 DIAGNOSIS — J45909 Unspecified asthma, uncomplicated: Secondary | ICD-10-CM | POA: Insufficient documentation

## 2017-02-02 DIAGNOSIS — Z87891 Personal history of nicotine dependence: Secondary | ICD-10-CM | POA: Diagnosis not present

## 2017-02-02 DIAGNOSIS — R0602 Shortness of breath: Secondary | ICD-10-CM | POA: Insufficient documentation

## 2017-02-02 DIAGNOSIS — Z79899 Other long term (current) drug therapy: Secondary | ICD-10-CM | POA: Diagnosis not present

## 2017-02-02 LAB — CBC WITH DIFFERENTIAL/PLATELET
BASOS ABS: 0.1 10*3/uL (ref 0.0–0.1)
BASOS PCT: 0 %
EOS ABS: 0.1 10*3/uL (ref 0.0–0.7)
Eosinophils Relative: 1 %
HCT: 36.3 % (ref 36.0–46.0)
HEMOGLOBIN: 12.4 g/dL (ref 12.0–15.0)
Lymphocytes Relative: 27 %
Lymphs Abs: 5.7 10*3/uL — ABNORMAL HIGH (ref 0.7–4.0)
MCH: 31.6 pg (ref 26.0–34.0)
MCHC: 34.2 g/dL (ref 30.0–36.0)
MCV: 92.4 fL (ref 78.0–100.0)
Monocytes Absolute: 1.3 10*3/uL — ABNORMAL HIGH (ref 0.1–1.0)
Monocytes Relative: 6 %
NEUTROS PCT: 66 %
Neutro Abs: 13.7 10*3/uL — ABNORMAL HIGH (ref 1.7–7.7)
Platelets: 370 10*3/uL (ref 150–400)
RBC: 3.93 MIL/uL (ref 3.87–5.11)
RDW: 13.3 % (ref 11.5–15.5)
WBC: 20.8 10*3/uL — AB (ref 4.0–10.5)

## 2017-02-02 LAB — COMPREHENSIVE METABOLIC PANEL
ALBUMIN: 4.1 g/dL (ref 3.5–5.0)
ALK PHOS: 54 U/L (ref 38–126)
ALT: 13 U/L — AB (ref 14–54)
AST: 19 U/L (ref 15–41)
Anion gap: 9 (ref 5–15)
BUN: 20 mg/dL (ref 6–20)
CALCIUM: 8.7 mg/dL — AB (ref 8.9–10.3)
CO2: 26 mmol/L (ref 22–32)
CREATININE: 0.72 mg/dL (ref 0.44–1.00)
Chloride: 101 mmol/L (ref 101–111)
GFR calc Af Amer: >60 mL/min (ref >60)
GFR calc non Af Amer: >60 mL/min (ref >60)
GLUCOSE: 95 mg/dL (ref 65–99)
Potassium: 3.2 mmol/L — ABNORMAL LOW (ref 3.5–5.1)
SODIUM: 136 mmol/L (ref 135–145)
Total Bilirubin: 0.6 mg/dL (ref 0.3–1.2)
Total Protein: 7.3 g/dL (ref 6.5–8.1)

## 2017-02-02 LAB — TROPONIN I: Troponin I: <0.03 ng/mL (ref <0.03)

## 2017-02-02 LAB — D-DIMER, QUANTITATIVE: D-Dimer, Quant: 2.82 ug/mL-FEU — ABNORMAL HIGH (ref 0.00–0.50)

## 2017-02-02 MED ORDER — IOPAMIDOL (ISOVUE-370) INJECTION 76%
100.0000 mL | Freq: Once | INTRAVENOUS | Status: AC | PRN
  Administered 2017-02-02: 100 mL via INTRAVENOUS

## 2017-02-02 MED ORDER — METHYLPREDNISOLONE SODIUM SUCC 125 MG IJ SOLR
INTRAMUSCULAR | Status: AC
  Administered 2017-02-02: 125 mg
  Filled 2017-02-02: qty 2

## 2017-02-02 MED ORDER — DIPHENHYDRAMINE HCL 50 MG/ML IJ SOLN
INTRAMUSCULAR | Status: AC
  Administered 2017-02-02: 50 mg
  Filled 2017-02-02: qty 1

## 2017-02-02 MED ORDER — POTASSIUM CHLORIDE CRYS ER 20 MEQ PO TBCR
40.0000 meq | EXTENDED_RELEASE_TABLET | Freq: Once | ORAL | Status: AC
  Administered 2017-02-02: 40 meq via ORAL
  Filled 2017-02-02: qty 2

## 2017-02-02 MED ORDER — POTASSIUM CHLORIDE CRYS ER 20 MEQ PO TBCR: 20.0000 meq | EXTENDED_RELEASE_TABLET | Freq: Two times a day (BID) | ORAL | 0 refills | Status: DC

## 2017-02-02 MED ORDER — IOPAMIDOL (ISOVUE-300) INJECTION 61%: 100.0000 mL | Freq: Once | INTRAVENOUS | Status: DC | PRN

## 2017-02-02 NOTE — ED Provider Notes (Signed)
[facility] Provider Note   CSN: [id number] Arrival date & time: 02/02/17  1734  By signing my name below, I, Doreatha Martin, attest that this documentation has been prepared under the direction and in the presence of  Demetrios Loll, PA-C. Electronically Signed: Doreatha Martin, ED Scribe. 02/02/17. 9:30 PM.    History   Chief Complaint Chief Complaint  Patient presents with  . Shortness of Breath    HPI Sherry [name] is a 46 y.o. female who presents to the Emergency Department complaining of persistent, gradually worsening, sharp, dull CP with radiation to the back and shoulder since 12/26/16. She states the pain moves around and radiates to her back. She was dx with RLL PNA 01/16/17 and treated with Levaquin and 2 rounds of prednisone by her PCP. Still finishing course of prednisone. Per pt, her symptoms continue to worsen. She stats she occasionally coughs, but it is not productive. Per pt, she has also noticed bilateral leg swelling and subjective fever. Pt states she recently quit smoking e-cigarettes last month prior to the onset of her symptoms. No h/o PE/DVT. Denies any recent prolonged immobilization, recent surgeries/hospitalizations, hormone use. She denies any cardiac history or significant family cardiac history. She denies additional complaints. The patient seen at urgent care at her primary care doctor for same and they recommend ER follow-up for PE rule out.  The history is provided by the patient. No language interpreter was used.    Past Medical History:  Diagnosis Date  . Allergy   . Arthritis   . Asthma   . Back pain   . Costochondritis   . Depression   . GERD (gastroesophageal reflux disease)   . Kidney stones   . Pityriasis rosea     There are no active problems to display for this patient.   Past Surgical History:  Procedure Laterality Date  . ABDOMINAL HYSTERECTOMY    . kidney stones    . LAPAROSCOPY    . LEEP    . TONSILLECTOMY    . WISDOM  TOOTH EXTRACTION      OB History    No data available       Home Medications    Prior to Admission medications   Medication Sig Start Date End Date Taking? Authorizing Provider  ALPRAZolam Prudy Feeler) 0.5 MG tablet Take 1 tablet (0.5 mg total) by mouth 2 (two) times daily as needed for sleep. Needs office visit (second notice) 04/04/12   Nelva Nay, PA-C  aspirin 325 MG tablet Take 325 mg by mouth daily as needed.    [provider]  EPINEPHrine (EPIPEN 2-PAK) 0.3 mg/0.3 mL SOAJ injection Inject 0.3 mLs (0.3 mg total) into the muscle once. 11/03/13   Nelva Nay, PA-C  fexofenadine-pseudoephedrine (ALLEGRA-D) 60-120 MG per tablet Take 1 tablet by mouth every morning.     [provider]  FLUoxetine (PROZAC) 40 MG capsule Take 40 mg by mouth daily.      [provider]  HYDROcodone-acetaminophen (NORCO) 10-325 MG per tablet Take 1-2 tablets by mouth every 8 (eight) hours as needed. For pain      [provider]  ibuprofen (ADVIL,MOTRIN) 800 MG tablet Take 800 mg by mouth as needed for pain.    [provider]  levofloxacin (LEVAQUIN) 500 MG tablet Take 1 tablet (500 mg total) by mouth daily. 01/16/17   Charlynne Pander, MD  methocarbamol (ROBAXIN) 500 MG tablet Take 500 mg by mouth every 8 (eight) hours as needed  for muscle spasms.     [provider]  nitroGLYCERIN (NITROSTAT) 0.4 MG SL tablet Place 0.4 mg under the tongue once.    [provider]  omeprazole (PRILOSEC) 20 MG capsule Take 20 mg by mouth daily as needed (indigestion).    [provider]  Oxycodone-Acetaminophen (PERCOCET PO) Take by mouth.    [provider]  predniSONE (DELTASONE) 20 MG tablet Take 2 tablets (40 mg total) by mouth daily. 06/24/14   Elvina Sidle, MD  ranitidine (ZANTAC) 150 MG tablet take 1 tablet by mouth twice a day if needed for ACID REFLUX 01/18/14   Godfrey Pick, PA-C    Family History Family History    Problem Relation Age of Onset  . Hypertension Mother     Social History Social History  Substance Use Topics  . Smoking status: Former Smoker    Types: E-cigarettes  . Smokeless tobacco: Current User  . Alcohol use Yes     Comment: occasional     Allergies   Latex and Iohexol   Review of Systems Review of Systems  Constitutional: Positive for fever.  Respiratory: Positive for cough.   Cardiovascular: Positive for chest pain. Negative for leg swelling.  Musculoskeletal: Positive for back pain.  All other systems reviewed and are negative.    Physical Exam Updated Vital Signs BP 106/68   Pulse 77   Temp 98.5 F (36.9 C) (Oral)   Resp 18   SpO2 98%   Physical Exam  Constitutional: She is oriented to person, place, and time. She appears well-developed and well-nourished.  Non-toxic appearing. NAD.    HENT:  Head: Normocephalic and atraumatic.  Eyes: Conjunctivae are normal.  Neck: Normal range of motion. Neck supple.  Cardiovascular: Normal rate, regular rhythm, normal heart sounds and intact distal pulses.  Exam reveals no gallop and no friction rub.   No murmur heard. DP pulses 2+   Pulmonary/Chest: Effort normal. No respiratory distress. She has no wheezes. She has no rales. She exhibits no tenderness.  Decreased breath sounds throughout   Abdominal: Soft. Bowel sounds are normal. She exhibits no distension. There is no tenderness. There is no rebound and no guarding.  Musculoskeletal: Normal range of motion. She exhibits no edema.  No lower extremity edema or calf tenderness  Neurological: She is alert and oriented to person, place, and time.  Skin: Skin is warm and dry. Capillary refill takes less than 2 seconds.  Psychiatric: She has a normal mood and affect. Her behavior is normal.  Nursing note and vitals reviewed.    ED Treatments / Results   DIAGNOSTIC STUDIES: Oxygen Saturation is 95% on RA, adequate by my interpretation.    COORDINATION OF  CARE: 9:27 PM Discussed treatment plan with pt at bedside which includes labs and pt agreed to plan.    Labs (all labs ordered are listed, but only abnormal results are displayed) Labs Reviewed  CBC WITH DIFFERENTIAL/PLATELET - Abnormal; Notable for the following:       Result Value   WBC 20.8 (*)    Neutro Abs 13.7 (*)    Lymphs Abs 5.7 (*)    Monocytes Absolute 1.3 (*)    All other components within normal limits  COMPREHENSIVE METABOLIC PANEL - Abnormal; Notable for the following:    Potassium 3.2 (*)    Calcium 8.7 (*)    ALT 13 (*)    All other components within normal limits  D-DIMER, QUANTITATIVE (NOT AT [facility]) - Abnormal; Notable  for the following:    D-Dimer, Quant 2.82 (*)    All other components within normal limits  TROPONIN I    EKG  EKG Interpretation  Date/Time:  Friday February 02 2017 17:54:32 EDT Ventricular Rate:  68 PR Interval:  128 QRS Duration: 78 QT Interval:  436 QTC Calculation: 463 R Axis:   37 Text Interpretation:  Normal sinus rhythm Normal ECG When compared to prior, no significant changes.  No STEMI Confirmed by Theda Belfastegeler, Chris ([id number]) on 02/02/2017 7:32:42 PM       Radiology Dg Chest 2 View  Result Date: 02/02/2017 CLINICAL DATA:  Chest pain and shortness of breath. EXAM: CHEST  2 VIEW COMPARISON:  01/16/2017 and prior exams FINDINGS: The cardiomediastinal silhouette is unremarkable. Peribronchial thickening again noted. There is no evidence of focal airspace disease, pulmonary edema, suspicious pulmonary nodule/mass, pleural effusion, or pneumothorax. No acute bony abnormalities are identified. IMPRESSION: No evidence of acute cardiopulmonary disease. Electronically Signed   By: Harmon PierJeffrey  Hu M.D.   On: 02/02/2017 18:17   Ct Angio Chest Pe W/cm &/or Wo Cm  Result Date: 02/02/2017 CLINICAL DATA:  Elevated D-dimer, chest pain EXAM: CT ANGIOGRAPHY CHEST WITH CONTRAST TECHNIQUE: Multidetector CT imaging of the chest was performed using the standard  protocol during bolus administration of intravenous contrast. Multiplanar CT image reconstructions and MIPs were obtained to evaluate the vascular anatomy. CONTRAST:  100 mL Isovue 370 intravenous COMPARISON:  Radiograph 02/02/2017, CT chest 03/21/2016 FINDINGS: Cardiovascular: Satisfactory opacification of the pulmonary arteries to the segmental level. No evidence of pulmonary embolism. Normal heart size. No pericardial effusion. Non aneurysmal aorta. No dissection is seen. Mediastinum/Nodes: No enlarged mediastinal, hilar, or axillary lymph nodes. Thyroid gland, trachea, and esophagus demonstrate no significant findings. Lungs/Pleura: No acute infiltrate or pleural effusion. Decreased right middle lobe pulmonary nodule measuring 3 mm compared with 4 mm previously, series 6, image number 42. Decreased left lower lobe subpleural pulmonary nodule measuring 6 mm compared with 7.5 mm previously, series 6, image number 51. Left lower lobe posterior pulmonary nodule measures 4 mm compared with 3 mm previously, series 6, image number 58. Upper Abdomen: No acute abnormality. Musculoskeletal: No chest wall abnormality. No acute or significant osseous findings. Review of the MIP images confirms the above findings. IMPRESSION: 1. Negative for acute pulmonary embolus or aortic dissection 2. Stable right middle and left lower lobe pulmonary nodules, benign. Electronically Signed   By: Jasmine PangKim  Fujinaga M.D.   On: 02/02/2017 22:36    Procedures Procedures (including critical care time)  Medications Ordered in ED Medications  methylPREDNISolone sodium succinate (SOLU-MEDROL) 125 mg/2 mL injection (125 mg  Given 02/02/17 2150)  diphenhydrAMINE (BENADRYL) 50 MG/ML injection (50 mg  Given 02/02/17 2150)  iopamidol (ISOVUE-370) 76 % injection 100 mL (100 mLs Intravenous Contrast Given 02/02/17 2212)  potassium chloride SA (K-DUR,KLOR-CON) CR tablet 40 mEq (40 mEq Oral Given 02/02/17 2358)     Initial Impression / Assessment and  Plan / ED Course  I have reviewed the triage vital signs and the nursing notes.  Pertinent labs & imaging results that were available during my care of the patient were reviewed by me and considered in my medical decision making (see chart for details).     Patient presents to the emergency department today with continued right-sided chest pressure and shortness of breath that has been ongoing for the past month. Patient has been treated for pneumonia with 2 rounds of steroids and symptoms have only slightly improved. Sent by urgent  care and primary care doctor for PE rule out. Patient is nontoxic appearing. She appears to be in no acute distress.  Lungs sounds are diminished bilaterally but no other acute abnormalities auscultated. No lower extremity edema or calf tenderness. Patient does have an elevated white blood cell count of 20,000 but is on prednisone at this time. Mild hypokalemia of 3.2 but no other acute abnormalities. D-dimer was elevated. Troponin negative. EKG shows no changes from prior tracing and no ischemic changes. Chest x-ray was unremarkable.  Patient does have an allergy to CT contrast. Patient refused 4 hour premedication. Spoke with the radiologist and my attending who felt that IV Benadryl and Solu-Medrol before CT scan was reasonable. Patient has had several CT scans prior to this one this evening and had no problems after by mouth Benadryl. Patient signed consent for not having premedication for CT. She was given IV Benadryl and Solu-Medrol. CT and chest revealed no PE or PNA. Does have stable right sided lung nodules that are not grown an interim.  Potassium was replaced orally. Have no significant expiration for patient's symptoms. The patient will likely need pulmonary follow-up. Doubt ACS given normal EKG and negative troponin with length of ongoing symptoms. Her lung nodules could be playing a role in her pain and sob. Patient encouraged to follow up with her PCP and  further referral at that time.  Pt is hemodynamically stable, in NAD, & able to ambulate in the ED. Pain has been managed & has no complaints prior to dc. Pt is comfortable with above plan and is stable for discharge at this time. All questions were answered prior to disposition. Strict return precautions for f/u to the ED were discussed. Dicussed with Dr. Rush Landmark who is agreeable to the above plan.      Final Clinical Impressions(s) / ED Diagnoses   Final diagnoses:  SOB (shortness of breath)    New Prescriptions Discharge Medication List as of 02/02/2017 11:42 PM    START taking these medications   Details  potassium chloride SA (K-DUR,KLOR-CON) 20 MEQ tablet Take 1 tablet (20 mEq total) by mouth 2 (two) times daily., Starting Fri 02/02/2017, Print        I personally performed the services described in this documentation, which was scribed in my presence. The recorded information has been reviewed and is accurate.    Rise Mu, PA-C 02/03/17 0128    Tegeler, Canary Brim, MD 02/03/17 [phone]

## 2017-02-02 NOTE — ED Notes (Signed)
Patient transported to CT with nurse and monitor 

## 2017-02-02 NOTE — Discharge Instructions (Signed)
Your imaging was normal. No signs of blood clot. Your heart tests were normal. Do not have good expiration for your shortness of breath. Your lung nodules are stable. I would follow-up with her primary care doctor for further referral to pulmonology. Return to the ED if he develops any worsening symptoms.

## 2017-02-02 NOTE — ED Notes (Signed)
MD made aware of labs results

## 2017-02-02 NOTE — ED Notes (Signed)
Pt argee to solumedrol and benadryl 20 mins prior to CTA ,obtained  written consent

## 2017-02-02 NOTE — ED Triage Notes (Signed)
Patient reports chest pain and shortness of breath.  Reports this has been ongoing since last seen in ER.  Reports took antibiotics and prednisone as prescribed and felt somewhat better but never felt "back to normal".  States that she called PCP and was referred for repeat chest xray-referred to ER for rule out PE. Denies cough.

## 2017-03-29 ENCOUNTER — Institutional Professional Consult (permissible substitution): Payer: Federal, State, Local not specified - PPO | Admitting: Pulmonary Disease

## 2018-08-02 ENCOUNTER — Other Ambulatory Visit (HOSPITAL_COMMUNITY)
Admission: AD | Admit: 2018-08-02 | Discharge: 2018-08-02 | Disposition: A | Discharge status: Home / Self Care | Payer: Federal, State, Local not specified - PPO | Source: Other Acute Inpatient Hospital | Attending: Urology | Admitting: Urology

## 2018-08-02 ENCOUNTER — Ambulatory Visit: Payer: Federal, State, Local not specified - PPO | Admitting: Urology

## 2018-08-02 DIAGNOSIS — R3915 Urgency of urination: Secondary | ICD-10-CM

## 2018-08-02 DIAGNOSIS — Z87440 Personal history of urinary (tract) infections: Secondary | ICD-10-CM

## 2018-08-02 DIAGNOSIS — R3982 Chronic bladder pain: Secondary | ICD-10-CM

## 2018-08-02 LAB — URINALYSIS, COMPLETE (UACMP) WITH MICROSCOPIC
BILIRUBIN URINE: NEGATIVE
Bacteria, UA: NONE SEEN
Glucose, UA: NEGATIVE mg/dL
HGB URINE DIPSTICK: NEGATIVE
KETONES UR: NEGATIVE mg/dL
Leukocytes, UA: NEGATIVE
NITRITE: NEGATIVE
Protein, ur: NEGATIVE mg/dL
Specific Gravity, Urine: 1.004 — ABNORMAL LOW (ref 1.005–1.030)
pH: 6 (ref 5.0–8.0)

## 2018-08-04 LAB — URINE CULTURE

## 2018-09-28 IMAGING — CR DG CHEST 2V
2 series · 2 of 2 positions shown · non-contrast
Comparison: 08/24/2016

CLINICAL DATA: PEADER in with pt She stopped smoking 2 weeks ago due to
chest pain. Now she is having pain between her shoulder blades. No
cough. Chest pain is tight with movement. Pressure like something is
sitting on her chest all day today.

EXAM:
CHEST  2 VIEW

[w chest pa]
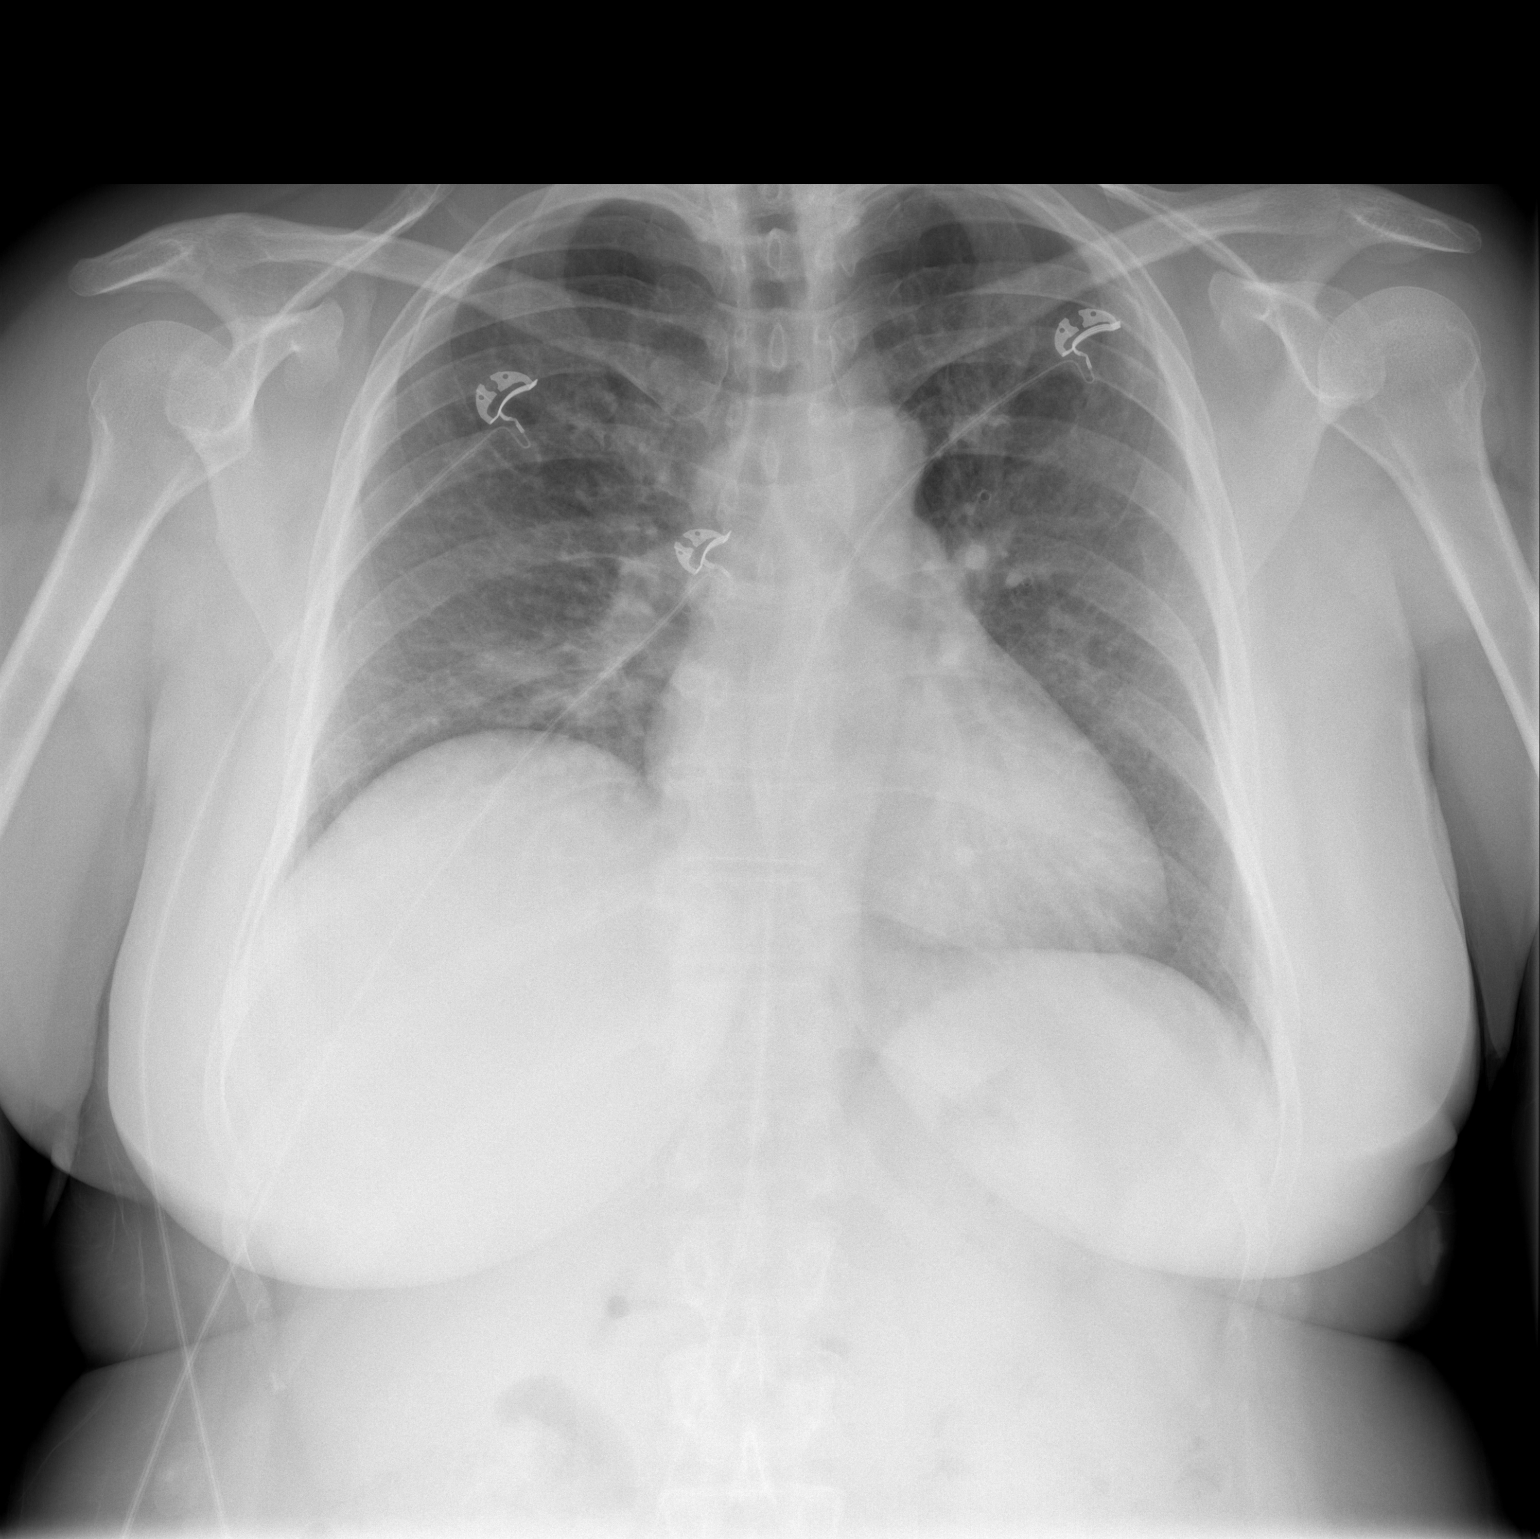

[w chest lat]
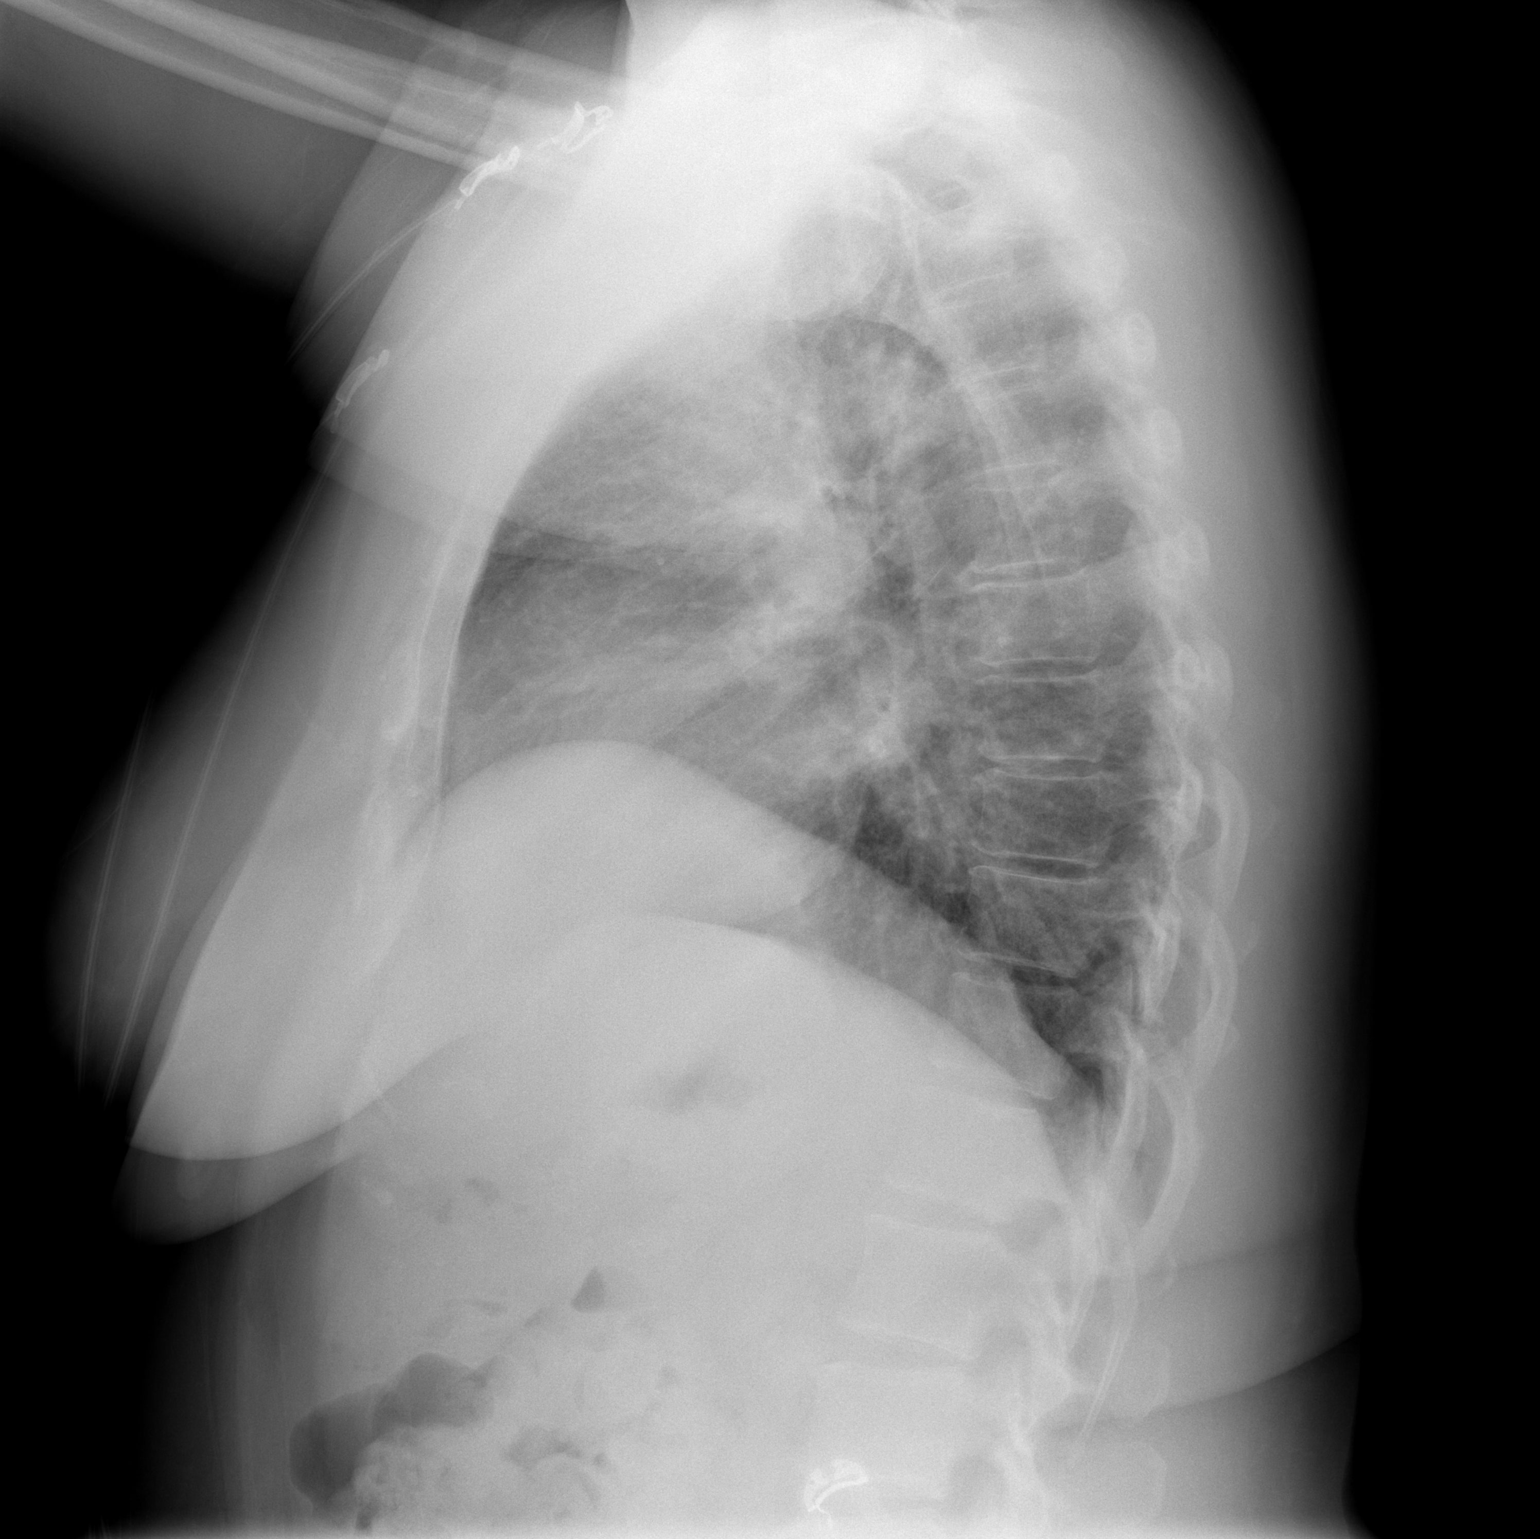

[2 of 2 positions shown; findings below may reference images not displayed]

FINDINGS: Shallow inspiration with elevation of right hemidiaphragm. Linear
atelectasis in the right lung base. No consolidation or airspace
disease. Heart size and pulmonary vascularity are normal.
Mediastinal contours appear intact. No pleural effusions. No
pneumothorax.
IMPRESSION: Elevation of right hemidiaphragm with mild linear atelectasis in the
right lung base.

## 2019-02-07 ENCOUNTER — Ambulatory Visit (INDEPENDENT_AMBULATORY_CARE_PROVIDER_SITE_OTHER): Payer: Federal, State, Local not specified - PPO | Admitting: Urology

## 2019-02-07 DIAGNOSIS — Z87442 Personal history of urinary calculi: Secondary | ICD-10-CM

## 2019-02-07 DIAGNOSIS — R3915 Urgency of urination: Secondary | ICD-10-CM | POA: Diagnosis not present

## 2019-02-07 DIAGNOSIS — Z87440 Personal history of urinary (tract) infections: Secondary | ICD-10-CM

## 2019-04-29 ENCOUNTER — Other Ambulatory Visit: Payer: Self-pay | Admitting: Obstetrics and Gynecology

## 2019-04-29 DIAGNOSIS — N6310 Unspecified lump in the right breast, unspecified quadrant: Secondary | ICD-10-CM

## 2019-05-09 ENCOUNTER — Other Ambulatory Visit: Payer: Self-pay | Admitting: Obstetrics and Gynecology

## 2019-05-09 ENCOUNTER — Ambulatory Visit
Admission: RE | Admit: 2019-05-09 | Discharge: 2019-05-09 | Disposition: A | Discharge status: Home / Self Care | Payer: Federal, State, Local not specified - PPO | Source: Ambulatory Visit | Attending: Obstetrics and Gynecology | Admitting: Obstetrics and Gynecology

## 2019-05-09 ENCOUNTER — Other Ambulatory Visit: Payer: Federal, State, Local not specified - PPO

## 2019-05-09 ENCOUNTER — Other Ambulatory Visit: Payer: Self-pay

## 2019-05-09 DIAGNOSIS — N6310 Unspecified lump in the right breast, unspecified quadrant: Secondary | ICD-10-CM

## 2019-06-20 ENCOUNTER — Other Ambulatory Visit (HOSPITAL_COMMUNITY): Payer: Self-pay | Admitting: Pulmonary Disease

## 2019-06-20 DIAGNOSIS — R079 Chest pain, unspecified: Secondary | ICD-10-CM

## 2019-07-04 ENCOUNTER — Ambulatory Visit (HOSPITAL_COMMUNITY)
Admission: RE | Admit: 2019-07-04 | Discharge: 2019-07-04 | Disposition: A | Discharge status: Home / Self Care | Payer: Federal, State, Local not specified - PPO | Source: Ambulatory Visit | Attending: Pulmonary Disease | Admitting: Pulmonary Disease

## 2019-07-04 ENCOUNTER — Other Ambulatory Visit: Payer: Self-pay

## 2019-07-04 DIAGNOSIS — R079 Chest pain, unspecified: Secondary | ICD-10-CM | POA: Diagnosis not present

## 2019-07-04 MED ORDER — IOHEXOL 350 MG/ML SOLN
100.0000 mL | Freq: Once | INTRAVENOUS | Status: AC | PRN
  Administered 2019-07-04: 100 mL via INTRAVENOUS

## 2019-07-04 NOTE — Progress Notes (Signed)
After scan patient stated she felt itchy in her throat and arm. Examined by Dr. Thornton Papas, ok for patient to go home, could take another benadryl at 4pm if needed, call us if things became worse or come to ER if becomes difficult to breath. Called patient at home around 5pm to follow up and she stated she did not feel itchy any more and symptoms had resolved.

## 2019-10-29 ENCOUNTER — Telehealth: Payer: Self-pay | Admitting: Urology

## 2019-10-29 NOTE — Telephone Encounter (Signed)
Pt reports symptoms improved with taking amoxicillin. But does not have enough to finish

## 2019-10-29 NOTE — Telephone Encounter (Signed)
Pt called and asked to speak to a nurse.

## 2019-10-29 NOTE — Telephone Encounter (Signed)
Pt reports lower back pain. Hx of stones. Felt like she was passing a stone. Pt took an amoxicillin starting Monday. I know you are out this week. Can she drop off a urine for culture? Feels like this pain/difficulty voiding  is related to a UTI

## 2019-10-29 NOTE — Telephone Encounter (Signed)
Ok to refill amoxicillin

## 2019-10-29 NOTE — Telephone Encounter (Signed)
Left message to return call 

## 2019-10-30 ENCOUNTER — Other Ambulatory Visit: Payer: Self-pay

## 2019-10-30 DIAGNOSIS — R300 Dysuria: Secondary | ICD-10-CM

## 2019-10-30 MED ORDER — FLUCONAZOLE 150 MG PO TABS: 150.0000 mg | ORAL_TABLET | Freq: Every day | ORAL | 0 refills | Status: DC | PRN

## 2019-10-30 MED ORDER — AMOXICILLIN 500 MG PO CAPS: 500.0000 mg | ORAL_CAPSULE | Freq: Three times a day (TID) | ORAL | 0 refills | Status: DC

## 2019-10-30 NOTE — Telephone Encounter (Signed)
Pt notified. Refill sent to [facility] in [place] per pt request. Pt states last amoxicillin rx she needed diflucan rx as well. Confirmed rx was sent from [facility]. diflucan sent as well per pt stating " I get really bad yeast from amoxicillin."

## 2019-11-12 ENCOUNTER — Other Ambulatory Visit: Payer: Self-pay

## 2019-11-12 ENCOUNTER — Ambulatory Visit
Admission: RE | Admit: 2019-11-12 | Discharge: 2019-11-12 | Disposition: A | Discharge status: Home / Self Care | Payer: Federal, State, Local not specified - PPO | Source: Ambulatory Visit | Attending: Obstetrics and Gynecology | Admitting: Obstetrics and Gynecology

## 2019-11-12 ENCOUNTER — Other Ambulatory Visit: Payer: Self-pay | Admitting: Obstetrics and Gynecology

## 2019-11-12 DIAGNOSIS — N6310 Unspecified lump in the right breast, unspecified quadrant: Secondary | ICD-10-CM

## 2020-04-28 ENCOUNTER — Ambulatory Visit
Admission: RE | Admit: 2020-04-28 | Discharge: 2020-04-28 | Disposition: A | Discharge status: Home / Self Care | Payer: Federal, State, Local not specified - PPO | Source: Ambulatory Visit | Attending: Obstetrics and Gynecology | Admitting: Obstetrics and Gynecology

## 2020-04-28 ENCOUNTER — Other Ambulatory Visit: Payer: Self-pay

## 2020-04-28 DIAGNOSIS — N6310 Unspecified lump in the right breast, unspecified quadrant: Secondary | ICD-10-CM

## 2020-11-20 ENCOUNTER — Ambulatory Visit
Admission: EM | Admit: 2020-11-20 | Discharge: 2020-11-20 | Disposition: A | Discharge status: Home / Self Care | Payer: Federal, State, Local not specified - PPO

## 2020-11-20 ENCOUNTER — Other Ambulatory Visit: Payer: Self-pay

## 2020-11-20 DIAGNOSIS — Z751 Person awaiting admission to adequate facility elsewhere: Secondary | ICD-10-CM

## 2020-11-20 DIAGNOSIS — R079 Chest pain, unspecified: Secondary | ICD-10-CM | POA: Diagnosis not present

## 2020-11-20 NOTE — ED Triage Notes (Signed)
CP to RT side under her breast x 1 week.   Having palpitations every night when she tries to go to sleep.

## 2020-11-20 NOTE — ED Notes (Signed)
Patient is being discharged from the Urgent Care and sent to the Emergency Department via pov . Per Azucena Fallen, PA patient is in need of higher level of care due to cp. Patient is aware and verbalizes understanding of plan of care.  Vitals:   11/20/20 1244  BP: (!) 152/87  Pulse: 86  Resp: 19  Temp: 98.2 F (36.8 C)  SpO2: 96%

## 2021-02-11 ENCOUNTER — Ambulatory Visit
Admission: RE | Admit: 2021-02-11 | Discharge: 2021-02-11 | Disposition: A | Discharge status: Home / Self Care | Payer: Federal, State, Local not specified - PPO | Source: Ambulatory Visit | Attending: Physical Medicine and Rehabilitation | Admitting: Physical Medicine and Rehabilitation

## 2021-02-11 ENCOUNTER — Other Ambulatory Visit: Payer: Self-pay | Admitting: Physical Medicine and Rehabilitation

## 2021-02-11 DIAGNOSIS — M25551 Pain in right hip: Secondary | ICD-10-CM

## 2021-06-14 ENCOUNTER — Ambulatory Visit: Payer: Federal, State, Local not specified - PPO | Admitting: Urology

## 2021-06-16 ENCOUNTER — Ambulatory Visit: Payer: Federal, State, Local not specified - PPO | Admitting: Urology

## 2021-06-16 ENCOUNTER — Encounter: Payer: Self-pay | Admitting: Urology

## 2021-06-16 ENCOUNTER — Other Ambulatory Visit: Payer: Self-pay

## 2021-06-16 VITALS — BP 125/82 | HR 86 | Temp 98.9°F | Wt 201.8 lb

## 2021-06-16 DIAGNOSIS — N952 Postmenopausal atrophic vaginitis: Secondary | ICD-10-CM | POA: Diagnosis not present

## 2021-06-16 DIAGNOSIS — Z87440 Personal history of urinary (tract) infections: Secondary | ICD-10-CM | POA: Diagnosis not present

## 2021-06-16 DIAGNOSIS — R3129 Other microscopic hematuria: Secondary | ICD-10-CM

## 2021-06-16 DIAGNOSIS — R300 Dysuria: Secondary | ICD-10-CM

## 2021-06-16 LAB — URINALYSIS, ROUTINE W REFLEX MICROSCOPIC
Bilirubin, UA: NEGATIVE
Glucose, UA: NEGATIVE
Ketones, UA: NEGATIVE
Leukocytes,UA: NEGATIVE
Nitrite, UA: NEGATIVE
Protein,UA: NEGATIVE
Specific Gravity, UA: 1.025 (ref 1.005–1.030)
Urobilinogen, Ur: 0.2 mg/dL (ref 0.2–1.0)
pH, UA: 6 (ref 5.0–7.5)

## 2021-06-16 LAB — MICROSCOPIC EXAMINATION: Renal Epithel, UA: NONE SEEN /hpf

## 2021-06-16 MED ORDER — PREMARIN 0.625 MG/GM VA CREA: TOPICAL_CREAM | VAGINAL | 12 refills | Status: AC

## 2021-06-16 MED ORDER — NITROFURANTOIN MONOHYD MACRO 100 MG PO CAPS: ORAL_CAPSULE | ORAL | 3 refills | Status: AC

## 2021-06-16 NOTE — Progress Notes (Signed)
Urological Symptom Review  Patient is experiencing the following symptoms: Burning/pain with urination Get up at night to urinate Leakage of urine Urinary tract infection   Review of Systems  Gastrointestinal (upper)  : Negative for upper GI symptoms  Gastrointestinal (lower) : Negative for lower GI symptoms  Constitutional : Night Sweats Fatigue  Skin: Negative for skin symptoms  Eyes: Negative for eye symptoms  Ear/Nose/Throat : Negative for Ear/Nose/Throat symptoms  Hematologic/Lymphatic: Negative for Hematologic/Lymphatic symptoms  Cardiovascular : Negative for cardiovascular symptoms  Respiratory : Negative for respiratory symptoms  Endocrine: Negative for endocrine symptoms  Musculoskeletal: Back pain Joint pain  Neurological: Headaches Dizziness  Psychologic: Negative for psychiatric symptoms

## 2021-06-16 NOTE — Progress Notes (Signed)
Subjective: 1. Dysuria   2. Personal history of urinary infection   3. Vaginal atrophy      Consult requested by Dr. Virl Son.  Sherry [name] returns today in f/u for recurrent UTI's and yeast infections.  She has had this issue since she started in her current relationship.   She last had a culture on 04/13/21 that was negative.   She also has a history of stones with prior intervention in 2010.  She had a CT in 2018 without stones.   She was started on topomax about 2-3 months ago for headaches.  She is having dysuria today.  She has no frequency or urgency.  She uses Azo almost daily.   She has some vaginal dryness. She has no vaginal d/c.  She last had antibiotic    ROS:  ROS  Allergies  Allergen Reactions   Latex Hives   Iohexol Hives and Itching     Code: HIVES, Desc: Pt broke out with several hives and had itchy eyes. We gave her 50 mg benadryl PO.   Will need benadryl prior to IV contrast in the future.  Thanks, J. Patrick North, Onset Date: [id number]     Past Medical History:  Diagnosis Date   Allergy    Arthritis    Asthma    Back pain    Costochondritis    Depression    GERD (gastroesophageal reflux disease)    Headache    Kidney stones    Pityriasis rosea     Past Surgical History:  Procedure Laterality Date   ABDOMINAL HYSTERECTOMY     kidney stones     LAPAROSCOPY     LEEP     TONSILLECTOMY     WISDOM TOOTH EXTRACTION      Social History   Socioeconomic History   Marital status: Single    Spouse name: Not on file   Number of children: Not on file   Years of education: Not on file   Highest education level: Not on file  Occupational History   Not on file  Tobacco Use   Smoking status: Former    Types: E-cigarettes   Smokeless tobacco: Current  Substance and Sexual Activity   Alcohol use: Yes    Comment: occasional   Drug use: No   Sexual activity: Never  Other Topics Concern   Not on file  Social History Narrative   Not on file   Social  Determinants of Health   Financial Resource Strain: Not on file  Food Insecurity: Not on file  Transportation Needs: Not on file  Physical Activity: Not on file  Stress: Not on file  Social Connections: Not on file  Intimate Partner Violence: Not on file    Family History  Problem Relation Age of Onset   Hypertension Mother     Anti-infectives: Anti-infectives (From admission, onward)    Start     Dose/Rate Route Frequency Ordered Stop   06/16/21 0000  nitrofurantoin, macrocrystal-monohydrate, (MACROBID) 100 MG capsule           06/16/21 1107         Current Outpatient Medications  Medication Sig Dispense Refill   ALPRAZolam (XANAX) 0.5 MG tablet Take 1 tablet (0.5 mg total) by mouth 2 (two) times daily as needed for sleep. Needs office visit (second notice) 30 tablet 0   aspirin 325 MG tablet Take 325 mg by mouth daily as needed.     EPINEPHrine (EPIPEN 2-PAK) 0.3 mg/0.3 mL SOAJ injection  Inject 0.3 mLs (0.3 mg total) into the muscle once. 1 Device 0   fexofenadine-pseudoephedrine (ALLEGRA-D) 60-120 MG per tablet Take 1 tablet by mouth every morning.      FLUoxetine (PROZAC) 40 MG capsule Take 40 mg by mouth daily.       HYDROcodone-acetaminophen (NORCO) 10-325 MG per tablet Take 1-2 tablets by mouth every 8 (eight) hours as needed. For pain       ibuprofen (ADVIL,MOTRIN) 800 MG tablet Take 800 mg by mouth as needed for pain.     methocarbamol (ROBAXIN) 500 MG tablet Take 500 mg by mouth every 8 (eight) hours as needed for muscle spasms.      nitrofurantoin, macrocrystal-monohydrate, (MACROBID) 100 MG capsule 1 po prn following sexual activity 30 capsule 3   omeprazole (PRILOSEC) 20 MG capsule Take 20 mg by mouth daily as needed (indigestion).     Oxycodone-Acetaminophen (PERCOCET PO) Take by mouth.     PREMARIN vaginal cream Use 0.5gm nightly vaginally for 2 weeks and then 2-3x weekly. 42.5 g 12   ranitidine (ZANTAC) 150 MG tablet take 1 tablet by mouth twice a day if  needed for ACID REFLUX 60 tablet 0   No current facility-administered medications for this visit.     Objective: Vital signs in last 24 hours: BP 125/82   Pulse 86   Temp 98.9 F (37.2 C)   Wt 201 lb 12.8 oz (91.5 kg)   BMI 31.61 kg/m   Intake/Output from previous day: No intake/output data recorded. Intake/Output this shift: @IOTHISSHIFT @   Physical Exam Vitals reviewed.  Constitutional:      Appearance: Normal appearance.  Genitourinary:    Comments: She has normal external genitalia without introital stenosis. Moderate vaginal mucosal atrophy. Urethral meatus normal without urethral hypermobility. Bladder non-tender without mass. No prolapse. Cervix and uterus absent.  Neurological:     Mental Status: She is alert.    Lab Results:  Results for orders placed or performed in visit on 06/16/21 (from the past 24 hour(s))  Urinalysis, Routine w reflex microscopic     Status: Abnormal   Collection Time: 06/16/21 10:27 AM  Result Value Ref Range   Specific Gravity, UA 1.025 1.005 - 1.030   pH, UA 6.0 5.0 - 7.5   Color, UA Yellow Yellow   Appearance Ur Clear Clear   Leukocytes,UA Negative Negative   Protein,UA Negative Negative/Trace   Glucose, UA Negative Negative   Ketones, UA Negative Negative   RBC, UA 1+ (A) Negative   Bilirubin, UA Negative Negative   Urobilinogen, Ur 0.2 0.2 - 1.0 mg/dL   Nitrite, UA Negative Negative   Microscopic Examination See below:    Narrative   Performed at:  [address] - [facility] [address] [place], [place]  [place] Lab Director: [id number], Phone:  [phone]  Microscopic Examination     Status: Abnormal   Collection Time: 06/16/21 10:27 AM   Urine  Result Value Ref Range   WBC, UA 0-5 0 - 5 /hpf   RBC 3-10 (A) 0 - 2 /hpf   Epithelial Cells (non renal) 0-10 0 - 10 /hpf   Renal Epithel, UA None seen None seen /hpf   Bacteria, UA Few (A) None seen/Few   Narrative   Performed at:  [address] - [facility]  [place] [address], [place], [place]  [place] Lab Director: [id number], Phone:  [phone]    BMET No results for input(s): NA, K, CL, CO2, GLUCOSE, BUN, CREATININE, CALCIUM  in the last 72 hours. PT/INR No results for input(s): LABPROT, INR in the last 72 hours. ABG No results for input(s): PHART, HCO3 in the last 72 hours.  Invalid input(s): PCO2, PO2 UA is unremarkable.  Studies/Results: No results found.   Assessment/Plan: Recurrent dysuria with post coital UTI's.  I will give her macrobid for post coital use.   Atrophic vaginitis.   I will get her started on topical estrogen and reviewed the side effects and instructions.  Microhematuria.   She will need further evaluation with a CT hematuria study and cystoscopy   Meds ordered this encounter  Medications   nitrofurantoin, macrocrystal-monohydrate, (MACROBID) 100 MG capsule    Sig: 1 po prn following sexual activity    Dispense:  30 capsule    Refill:  3   PREMARIN vaginal cream    Sig: Use 0.5gm nightly vaginally for 2 weeks and then 2-3x weekly.    Dispense:  42.5 g    Refill:  12      Orders Placed This Encounter  Procedures   Microscopic Examination   Urinalysis, Routine w reflex microscopic     Return in about 3 months (around 09/16/2021).    CC: Dr. Virl Son.      Sherry [name] 06/16/2021 [phone] Patient ID: Sherry [name], female   DOB: 1971-02-04, 50 y.o.   MRN: [id number]

## 2021-06-21 ENCOUNTER — Telehealth: Payer: Self-pay

## 2021-06-21 NOTE — Telephone Encounter (Signed)
-----   Message from Bjorn Pippin, MD sent at 06/16/2021  7:43 PM EDT ----- I was closing out my charts and noted that Sahana had 3-10 RBC's on her UA today.  I didn't pick that up when she was in the office for some reason, but with her recurrent UTI's and voiding symptoms, particularly since she is now on topomax which can lead to stones,   I would like to get a CT hematuria study on her and we will need to consider cystoscopy at f/u.   Please let her know I apologize for not bringing this up at the visit.

## 2021-06-21 NOTE — Telephone Encounter (Signed)
Left message for patient to return call to office. 

## 2021-08-01 ENCOUNTER — Other Ambulatory Visit: Payer: Self-pay

## 2021-08-01 ENCOUNTER — Ambulatory Visit
Admission: EM | Admit: 2021-08-01 | Discharge: 2021-08-01 | Disposition: A | Discharge status: Home / Self Care | Payer: Federal, State, Local not specified - PPO

## 2021-08-01 DIAGNOSIS — R509 Fever, unspecified: Secondary | ICD-10-CM | POA: Diagnosis not present

## 2021-08-01 MED ORDER — OSELTAMIVIR PHOSPHATE 75 MG PO CAPS: 75.0000 mg | ORAL_CAPSULE | Freq: Two times a day (BID) | ORAL | 0 refills | Status: AC

## 2021-08-01 NOTE — ED Provider Notes (Signed)
[facility]    CSN: [id number] Arrival date & time: 08/01/21  0803      History   Chief Complaint Chief Complaint  Patient presents with   Fever   Sore Throat   Headache    HPI Sherry [name] is a 50 y.o. female.   The history is provided by the patient.  Cough Cough characteristics:  Non-productive Sputum characteristics:  Nondescript Severity:  Moderate Onset quality:  Gradual Duration:  3 days Timing:  Constant Chronicity:  New Relieved by:  Nothing Associated symptoms: fever and headaches   Risk factors: recent infection   Pt's husband has flu and covid  Pt request tamiflu Past Medical History:  Diagnosis Date   Allergy    Arthritis    Asthma    Back pain    Costochondritis    Depression    GERD (gastroesophageal reflux disease)    Headache    Kidney stones    Pityriasis rosea     There are no problems to display for this patient.   Past Surgical History:  Procedure Laterality Date   ABDOMINAL HYSTERECTOMY     kidney stones     LAPAROSCOPY     LEEP     TONSILLECTOMY     WISDOM TOOTH EXTRACTION      OB History   No obstetric history on file.      Home Medications    Prior to Admission medications   Medication Sig Start Date End Date Taking? Authorizing Provider  oseltamivir (TAMIFLU) 75 MG capsule Take 1 capsule (75 mg total) by mouth 2 (two) times daily for 10 days. 08/01/21 08/11/21 Yes Cheron Schaumann K, PA-C  topiramate (TOPAMAX) 25 MG tablet Take 25 mg by mouth 2 (two) times daily.   Yes [provider]  ALPRAZolam (XANAX) 0.5 MG tablet Take 1 tablet (0.5 mg total) by mouth 2 (two) times daily as needed for sleep. Needs office visit (second notice) Patient not taking: Reported on 08/01/2021 04/04/12   Nelva Nay, PA-C  aspirin 325 MG tablet Take 325 mg by mouth daily as needed.    [provider]  EPINEPHrine (EPIPEN 2-PAK) 0.3 mg/0.3 mL SOAJ injection Inject 0.3 mLs (0.3 mg total) into the muscle  once. 11/03/13   Nelva Nay, PA-C  fexofenadine-pseudoephedrine (ALLEGRA-D) 60-120 MG per tablet Take 1 tablet by mouth every morning.     [provider]  FLUoxetine (PROZAC) 40 MG capsule Take 40 mg by mouth daily.      [provider]  HYDROcodone-acetaminophen (NORCO) 10-325 MG per tablet Take 1-2 tablets by mouth every 8 (eight) hours as needed. For pain   Patient not taking: Reported on 08/01/2021    [provider]  ibuprofen (ADVIL,MOTRIN) 800 MG tablet Take 800 mg by mouth as needed for pain.    [provider]  methocarbamol (ROBAXIN) 500 MG tablet Take 500 mg by mouth every 8 (eight) hours as needed for muscle spasms.     [provider]  nitrofurantoin, macrocrystal-monohydrate, (MACROBID) 100 MG capsule 1 po prn following sexual activity Patient not taking: Reported on 08/01/2021 06/16/21   Bjorn Pippin, MD  omeprazole (PRILOSEC) 20 MG capsule Take 20 mg by mouth daily as needed (indigestion).    [provider]  Oxycodone-Acetaminophen (PERCOCET PO) Take by mouth.    [provider]  PREMARIN vaginal cream Use 0.5gm nightly vaginally for 2 weeks and then 2-3x weekly. 06/16/21   Bjorn Pippin, MD  ranitidine Orpha Bur)  150 MG tablet take 1 tablet by mouth twice a day if needed for ACID REFLUX 01/18/14   Godfrey Pick, PA-C    Family History Family History  Problem Relation Age of Onset   Hypertension Mother     Social History Social History   Tobacco Use   Smoking status: Former    Types: E-cigarettes   Smokeless tobacco: Current  Substance Use Topics   Alcohol use: Yes    Comment: occasional   Drug use: No     Allergies   Latex and Iohexol   Review of Systems Review of Systems  Constitutional:  Positive for fever.  Respiratory:  Positive for cough.   Neurological:  Positive for headaches.  All other systems reviewed and are negative.   Physical Exam Triage Vital Signs ED Triage Vitals  Enc  Vitals Group     BP 08/01/21 0814 129/86     Pulse Rate 08/01/21 0814 92     Resp 08/01/21 0814 14     Temp 08/01/21 0814 99.4 F (37.4 C)     Temp Source 08/01/21 0814 Oral     SpO2 08/01/21 0814 95 %     Weight 08/01/21 0815 205 lb (93 kg)     Height --      Head Circumference --      Peak Flow --      Pain Score 08/01/21 0815 4     Pain Loc --      Pain Edu? --      Excl. in GC? --    No data found.  Updated Vital Signs BP 129/86 (BP Location: Right Arm)   Pulse 92   Temp 99.4 F (37.4 C) (Oral)   Resp 14   Wt 93 kg   SpO2 95%   BMI 32.11 kg/m   Visual Acuity Right Eye Distance:   Left Eye Distance:   Bilateral Distance:    Right Eye Near:   Left Eye Near:    Bilateral Near:     Physical Exam Vitals and nursing note reviewed.  Constitutional:      Appearance: She is well-developed.  HENT:     Head: Normocephalic.     Mouth/Throat:     Mouth: Mucous membranes are moist.  Cardiovascular:     Rate and Rhythm: Normal rate.  Pulmonary:     Effort: Pulmonary effort is normal.  Abdominal:     General: There is no distension.  Musculoskeletal:        General: Normal range of motion.     Cervical back: Normal range of motion.  Skin:    General: Skin is warm.  Neurological:     Mental Status: She is alert and oriented to person, place, and time.     UC Treatments / Results  Labs (all labs ordered are listed, but only abnormal results are displayed) Labs Reviewed  COVID-19, FLU A+B NAA    EKG   Radiology No results found.  Procedures Procedures (including critical care time)  Medications Ordered in UC Medications - No data to display  Initial Impression / Assessment and Plan / UC Course  I have reviewed the triage vital signs and the nursing notes.  Pertinent labs & imaging results that were available during my care of the patient were reviewed by me and considered in my medical decision making (see chart for details).     MDM:   influenza and covid pending Final Clinical Impressions(s) / UC Diagnoses  Final diagnoses:  Fever, unspecified fever cause     Discharge Instructions      Influenza and covid are pending   ED Prescriptions     Medication Sig Dispense Auth. Provider   oseltamivir (TAMIFLU) 75 MG capsule Take 1 capsule (75 mg total) by mouth 2 (two) times daily for 10 days. 20 capsule Elson Areas, [place]      PDMP not reviewed this encounter.   Elson Areas, [place] 08/01/21 [id number]

## 2021-08-01 NOTE — Discharge Instructions (Addendum)
Influenza and covid are pending

## 2021-08-01 NOTE — ED Triage Notes (Signed)
Pt presents with headache, fever, sore throat that began Thursday. Pt states her fiance tested positive for flu and covid.

## 2021-08-02 LAB — COVID-19, FLU A+B NAA
Influenza A, NAA: NOT DETECTED
Influenza B, NAA: NOT DETECTED
SARS-CoV-2, NAA: DETECTED — AB

## 2021-09-14 ENCOUNTER — Ambulatory Visit (HOSPITAL_COMMUNITY)
Admission: RE | Admit: 2021-09-14 | Discharge: 2021-09-14 | Disposition: A | Discharge status: Home / Self Care | Payer: Federal, State, Local not specified - PPO | Source: Ambulatory Visit | Attending: Urology | Admitting: Urology

## 2021-09-14 ENCOUNTER — Other Ambulatory Visit: Payer: Self-pay

## 2021-09-14 ENCOUNTER — Telehealth: Payer: Self-pay

## 2021-09-14 DIAGNOSIS — R3129 Other microscopic hematuria: Secondary | ICD-10-CM

## 2021-09-14 MED ORDER — DIPHENHYDRAMINE HCL 50 MG PO TABS: 50.0000 mg | ORAL_TABLET | Freq: Once | ORAL | 0 refills | Status: AC

## 2021-09-14 MED ORDER — PREDNISONE 50 MG PO TABS: ORAL_TABLET | ORAL | 0 refills | Status: DC

## 2021-09-14 NOTE — Telephone Encounter (Signed)
CT contrast allergy prep sent to pharmacy per Dr. Annabell Howells.  Medication instructions reviewed with patient and voiced understanding.

## 2021-09-15 ENCOUNTER — Other Ambulatory Visit: Payer: Self-pay

## 2021-09-15 ENCOUNTER — Other Ambulatory Visit: Payer: Federal, State, Local not specified - PPO | Admitting: Urology

## 2021-09-15 DIAGNOSIS — R300 Dysuria: Secondary | ICD-10-CM

## 2021-09-15 DIAGNOSIS — R3129 Other microscopic hematuria: Secondary | ICD-10-CM

## 2021-09-15 DIAGNOSIS — Z87440 Personal history of urinary (tract) infections: Secondary | ICD-10-CM

## 2021-09-15 MED ORDER — PREDNISONE 50 MG PO TABS: ORAL_TABLET | ORAL | 0 refills | Status: AC

## 2021-09-15 MED ORDER — PREDNISONE 50 MG PO TABS: ORAL_TABLET | ORAL | 0 refills | Status: DC

## 2021-09-20 ENCOUNTER — Ambulatory Visit (HOSPITAL_COMMUNITY)
Admission: RE | Admit: 2021-09-20 | Discharge: 2021-09-20 | Disposition: A | Discharge status: Home / Self Care | Payer: Federal, State, Local not specified - PPO | Source: Ambulatory Visit | Attending: Urology | Admitting: Urology

## 2021-09-20 ENCOUNTER — Other Ambulatory Visit: Payer: Self-pay

## 2021-09-20 DIAGNOSIS — R3129 Other microscopic hematuria: Secondary | ICD-10-CM | POA: Diagnosis present

## 2021-09-20 MED ORDER — IOHEXOL 300 MG/ML  SOLN
125.0000 mL | Freq: Once | INTRAMUSCULAR | Status: AC | PRN
  Administered 2021-09-20: 08:00:00 125 mL via INTRAVENOUS

## 2021-09-20 NOTE — Progress Notes (Signed)
No visible rash, no difficulty breathing.

## 2021-09-20 NOTE — Progress Notes (Addendum)
Called to CT by CT staff.  Pt having some itching after receiving IV contrast.  No rash visible.  Pt took 50 mg of benadryl po prior to arrival.  Will monitor pt's vs q 15 min x 1 hour per vo Dr Thornton Papas.

## 2021-09-20 NOTE — Progress Notes (Signed)
Pt states her itching feels better.  No visible rash, no difficulty breathing.

## 2021-09-20 NOTE — Progress Notes (Signed)
Itching is much better per patient.  No visible rash, no difficulty breathing.

## 2021-09-20 NOTE — Progress Notes (Signed)
Pt's itching has resolved, no visible rash, no difficulty breathing.  Pt instructed to go to ED if she starts to develop rash, swelling or any difficulty breathing.  Pt verbalizes understanding of instructions.  D/c to home at this time.

## 2021-09-22 ENCOUNTER — Telehealth: Payer: Self-pay

## 2021-09-22 NOTE — Telephone Encounter (Signed)
-----   Message from Bjorn Pippin, MD sent at 09/22/2021 11:35 AM EST ----- No urinary issues noted on CT.  She has some lung changes that are probably from her history of asthma.  F/u as planned.  ----- Message ----- From: Ferdinand Lango, RN Sent: 09/21/2021   9:47 AM EST To: Bjorn Pippin, MD  Please review

## 2021-09-22 NOTE — Telephone Encounter (Signed)
Patient called and notified.

## 2021-09-29 ENCOUNTER — Ambulatory Visit: Payer: Federal, State, Local not specified - PPO | Admitting: Urology

## 2021-09-29 ENCOUNTER — Encounter: Payer: Self-pay | Admitting: Urology

## 2021-09-29 ENCOUNTER — Other Ambulatory Visit: Payer: Self-pay

## 2021-09-29 VITALS — BP 118/80 | HR 88 | Wt 198.0 lb

## 2021-09-29 DIAGNOSIS — R300 Dysuria: Secondary | ICD-10-CM | POA: Diagnosis not present

## 2021-09-29 DIAGNOSIS — N952 Postmenopausal atrophic vaginitis: Secondary | ICD-10-CM

## 2021-09-29 DIAGNOSIS — N760 Acute vaginitis: Secondary | ICD-10-CM

## 2021-09-29 DIAGNOSIS — Z87440 Personal history of urinary (tract) infections: Secondary | ICD-10-CM

## 2021-09-29 DIAGNOSIS — R3129 Other microscopic hematuria: Secondary | ICD-10-CM | POA: Diagnosis not present

## 2021-09-29 LAB — URINALYSIS, ROUTINE W REFLEX MICROSCOPIC
Bilirubin, UA: NEGATIVE
Glucose, UA: NEGATIVE
Ketones, UA: NEGATIVE
Leukocytes,UA: NEGATIVE
Nitrite, UA: NEGATIVE
Protein,UA: NEGATIVE
Specific Gravity, UA: 1.02 (ref 1.005–1.030)
Urobilinogen, Ur: 0.2 mg/dL (ref 0.2–1.0)
pH, UA: 7 (ref 5.0–7.5)

## 2021-09-29 LAB — MICROSCOPIC EXAMINATION: Renal Epithel, UA: NONE SEEN /hpf

## 2021-09-29 MED ORDER — METRONIDAZOLE 0.75 % VA GEL: 1.0000 | Freq: Every day | VAGINAL | 1 refills | Status: AC

## 2021-09-29 MED ORDER — FLUCONAZOLE 150 MG PO TABS: 150.0000 mg | ORAL_TABLET | Freq: Once | ORAL | 1 refills | Status: AC

## 2021-09-29 MED ORDER — CIPROFLOXACIN HCL 500 MG PO TABS
500.0000 mg | ORAL_TABLET | Freq: Once | ORAL | Status: AC
  Administered 2021-09-29: 500 mg via ORAL

## 2021-09-29 NOTE — Progress Notes (Signed)
Urological Symptom Review  Patient is experiencing the following symptoms: Burning/pain with urination Urinary tract infection Painful intercourse   Review of Systems  Gastrointestinal (upper)  : Negative for upper GI symptoms  Gastrointestinal (lower) : Diarrhea  Constitutional : Night Sweats  Skin: Negative for skin symptoms  Eyes: Negative for eye symptoms  Ear/Nose/Throat : Negative for Ear/Nose/Throat symptoms  Hematologic/Lymphatic: Negative for Hematologic/Lymphatic symptoms  Cardiovascular : Negative for cardiovascular symptoms  Respiratory : Shortness of breath  Endocrine: Negative for endocrine symptoms  Musculoskeletal: Back pain Joint pain  Neurological: Headaches  Psychologic: Depression Anxiety

## 2021-09-29 NOTE — Progress Notes (Signed)
Subjective: 1. Microhematuria   2. Personal history of urinary infection   3. Dysuria   4. Vaginal atrophy   5. Vaginosis       06/16/21: Sherry [name] returns today in f/u for recurrent UTI's and yeast infections.  She has had this issue since she started in her current relationship.   She last had a culture on 04/13/21 that was negative.   She also has a history of stones with prior intervention in 2010.  She had a CT in 2018 without stones.   She was started on topomax about 2-3 months ago for headaches.  She is having dysuria today.  She has no frequency or urgency.  She uses Azo almost daily.   She has some vaginal dryness. She has no vaginal d/c.  She last had antibiotic    09/29/21: Sherry [name] returns today in f/u for cystoscopy for further evaluation of her microhematuria, voiding symptoms and recurrent UTI's.  A CT hematuria study on 09/20/21 was negative. She remains on nitrofurantoin for suppression.  She has some persistent burning in the urethral area.  It is not aggravated by voiding.  She has no frequency or urgency.   She has no vaginal discharge. She didn't have reduced burning with the premarin cream but it has helped with the dysparunia.  Her UA has trace blood and amorphous phosphates today.    ROS:  ROS  Allergies  Allergen Reactions   Latex Hives   Iohexol Hives and Itching     Update: 09/20/2021. Patient had contrast reaction after taking 13 hour pre med. Per Dr. Thornton Papas patient can only have contrast in emergent situations that have been clinically reviewed. Code: HIVES, Desc: Pt broke out with several hives and had itchy eyes. We gave her 50 mg benadryl PO.   Will need benadryl prior to IV contrast in the future.  Thanks, J. Cephas Darby, Onset Date: [id number]     Past Medical History:  Diagnosis Date   Allergy    Arthritis    Asthma    Back pain    Costochondritis    Depression    GERD (gastroesophageal reflux disease)    Headache    Kidney stones    Pityriasis rosea      Past Surgical History:  Procedure Laterality Date   ABDOMINAL HYSTERECTOMY     kidney stones     LAPAROSCOPY     LEEP     TONSILLECTOMY     WISDOM TOOTH EXTRACTION      Social History   Socioeconomic History   Marital status: Single    Spouse name: Not on file   Number of children: Not on file   Years of education: Not on file   Highest education level: Not on file  Occupational History   Not on file  Tobacco Use   Smoking status: Former    Types: E-cigarettes   Smokeless tobacco: Current  Substance and Sexual Activity   Alcohol use: Yes    Comment: occasional   Drug use: No   Sexual activity: Never  Other Topics Concern   Not on file  Social History Narrative   Not on file   Social Determinants of Health   Financial Resource Strain: Not on file  Food Insecurity: Not on file  Transportation Needs: Not on file  Physical Activity: Not on file  Stress: Not on file  Social Connections: Not on file  Intimate Partner Violence: Not on file    Family History  Problem Relation  Age of Onset   Hypertension Mother     Anti-infectives: Anti-infectives (From admission, onward)    Start     Dose/Rate Route Frequency Ordered Stop   09/29/21 1015  CIPROFLOXACIN HCL 500 MG PO TABS        500 mg Oral  Once 09/29/21 1014 09/29/21 1015   09/29/21 0000  fluconazole (DIFLUCAN) 150 MG tablet        150 mg Oral  Once 09/29/21 1038 09/29/21 2359       Current Outpatient Medications  Medication Sig Dispense Refill   fluconazole (DIFLUCAN) 150 MG tablet Take 1 tablet (150 mg total) by mouth once for 1 dose. 1 tablet 1   metroNIDAZOLE (METROGEL) 0.75 % vaginal gel Place 1 Applicatorful vaginally at bedtime. 70 g 1   ALPRAZolam (XANAX) 0.5 MG tablet Take 1 tablet (0.5 mg total) by mouth 2 (two) times daily as needed for sleep. Needs office visit (second notice) (Patient not taking: Reported on 08/01/2021) 30 tablet 0   aspirin 325 MG tablet Take 325 mg by mouth daily as  needed.     diphenhydrAMINE (BENADRYL) 50 MG tablet Take 1 tablet (50 mg total) by mouth once for 1 dose. Take 1 hour prior to CT scan 30 tablet 0   EPINEPHrine (EPIPEN 2-PAK) 0.3 mg/0.3 mL SOAJ injection Inject 0.3 mLs (0.3 mg total) into the muscle once. 1 Device 0   fexofenadine-pseudoephedrine (ALLEGRA-D) 60-120 MG per tablet Take 1 tablet by mouth every morning.      FLUoxetine (PROZAC) 40 MG capsule Take 40 mg by mouth daily.       ibuprofen (ADVIL,MOTRIN) 800 MG tablet Take 800 mg by mouth as needed for pain.     methocarbamol (ROBAXIN) 500 MG tablet Take 500 mg by mouth every 8 (eight) hours as needed for muscle spasms.      nitrofurantoin, macrocrystal-monohydrate, (MACROBID) 100 MG capsule 1 po prn following sexual activity (Patient not taking: Reported on 08/01/2021) 30 capsule 3   omeprazole (PRILOSEC) 20 MG capsule Take 20 mg by mouth daily as needed (indigestion).     Oxycodone-Acetaminophen (PERCOCET PO) Take by mouth.     predniSONE (DELTASONE) 50 MG tablet Take 1 tablet by mouth 13 hours prior to CT Then take 1 tablet by mouth 7 hours prior to CT Then take 1 tablet by mouth 1 hour prior to CT 3 tablet 0   PREMARIN vaginal cream Use 0.5gm nightly vaginally for 2 weeks and then 2-3x weekly. 42.5 g 12   ranitidine (ZANTAC) 150 MG tablet take 1 tablet by mouth twice a day if needed for ACID REFLUX 60 tablet 0   topiramate (TOPAMAX) 25 MG tablet Take 25 mg by mouth 2 (two) times daily.     No current facility-administered medications for this visit.     Objective: Vital signs in last 24 hours: BP 118/80    Pulse 88    Wt 198 lb (89.8 kg)    BMI 31.01 kg/m   Intake/Output from previous day: No intake/output data recorded. Intake/Output this shift: @IOTHISSHIFT @   Physical Exam  Lab Results:  Results for orders placed or performed in visit on 09/29/21 (from the past 24 hour(s))  Urinalysis, Routine w reflex microscopic     Status: Abnormal   Collection Time: 09/29/21  11:03 AM  Result Value Ref Range   Specific Gravity, UA 1.020 1.005 - 1.030   pH, UA 7.0 5.0 - 7.5   Color, UA Amber (A) Yellow  Appearance Ur Cloudy (A) Clear   Leukocytes,UA Negative Negative   Protein,UA Negative Negative/Trace   Glucose, UA Negative Negative   Ketones, UA Negative Negative   RBC, UA Trace (A) Negative   Bilirubin, UA Negative Negative   Urobilinogen, Ur 0.2 0.2 - 1.0 mg/dL   Nitrite, UA Negative Negative   Microscopic Examination See below:    Narrative   Performed at:  [facility] [id number], [place], [place]  [id number] Lab Director: Mina Marble MT, Phone:  [phone]  Microscopic Examination     Status: Abnormal   Collection Time: 09/29/21 11:03 AM   Urine  Result Value Ref Range   WBC, UA 0-5 0 - 5 /hpf   RBC 0-2 0 - 2 /hpf   Epithelial Cells (non renal) 0-10 0 - 10 /hpf   Renal Epithel, UA None seen None seen /hpf   Crystals Present N/A   Crystal Type Amorphous Sediment N/A   Bacteria, UA Few (A) None seen/Few   Narrative   Performed at:  [facility] [address] [place], [place]  [id number] Lab Director: Grampian, Phone:  [phone]     BMET No results for input(s): NA, K, CL, CO2, GLUCOSE, BUN, CREATININE, CALCIUM in the last 72 hours. PT/INR No results for input(s): LABPROT, INR in the last 72 hours. ABG No results for input(s): PHART, HCO3 in the last 72 hours.  Invalid input(s): PCO2, PO2 UA is unremarkable.  Studies/Results: No results found. CT HEMATURIA WORKUP  Result Date: 09/21/2021 CLINICAL DATA:  Microscopic hematuria and frequent UTIs x4 years, worsening over the last year. EXAM: CT ABDOMEN AND PELVIS WITHOUT AND WITH CONTRAST TECHNIQUE: Multidetector CT imaging of the abdomen and pelvis was performed following the standard protocol before and following the bolus administration of intravenous contrast. RADIATION DOSE REDUCTION: This exam was performed according to the departmental  dose-optimization program which includes automated exposure control, adjustment of the mA and/or kV according to patient size and/or use of iterative reconstruction technique. CONTRAST:  179mL OMNIPAQUE IOHEXOL 300 MG/ML  SOLN COMPARISON:  June 04, 2017 FINDINGS: Lower chest: Diffuse bronchial wall thickening with mosaic attenuation of the lungs. Left lower lobe pulmonary nodule measuring 6 mm is stable over multiple prior studies and consistent with a benign etiology. Hepatobiliary: No suspicious hepatic lesion. Gallbladder is unremarkable. No biliary ductal dilation. Pancreas: No pancreatic ductal dilation or evidence of acute inflammation. Spleen: Normal in size without focal abnormality. Adrenals/Urinary Tract: Bilateral adrenal glands appear normal. No hydronephrosis. No renal, ureteral or bladder calculi identified. No solid enhancing renal mass. Hypodense subcentimeter lesion in the right upper pole is technically too small to accurately characterize but statistically likely to reflect a cyst. The kidneys demonstrate symmetric enhancement and excretion of contrast material. No collecting system duplication. No suspicious filling defect visualized within the opacified portions of the collecting systems or ureters on delayed imaging. Evaluation of the urinary bladder is somewhat limited by urine contamination within this context there is no abnormal wall thickening or suspicious intraluminal filling defect identified. Stomach/Bowel: No enteric contrast was administered. Stomach is unremarkable for degree of distension. No pathologic dilation of small or large bowel. The appendix and terminal ileum appear normal. No suspicious colonic wall thickening or mass like lesions identified. No evidence of acute bowel inflammation. Vascular/Lymphatic: No abdominal aortic aneurysm. No pathologically enlarged abdominal or pelvic lymph nodes. Reproductive: Status post hysterectomy. No adnexal masses. Other: No significant  abdominopelvic free fluid. Musculoskeletal: L5-S1 discogenic  disease. Lower lumbar facet hypertrophy. No acute osseous abnormality. IMPRESSION: 1. No hydronephrosis. No renal, ureteral or bladder calculi identified. 2. No solid enhancing renal mass. 3. Diffuse bronchial wall thickening with mosaic attenuation of the lungs, which can be seen in the setting of small airways disease. Electronically Signed   By: Dahlia Bailiff M.D.   On: 09/21/2021 09:24    Procedure: cystoscopy.  She was prepped with betadine.   The scope was passed.  The urethra was normal.  The bladder wall was smooth and pale without lesions.  There was mild squamous metaplasia of the trigone. The UO's were normal, effluxing clear urine.   Cipro 500mg  was given po.    Assessment/Plan: Recurrent dysuria with post coital UTI's.  Continue nitrofurantoin post coitally.   Atrophic vaginitis.   She has some benefit with the premarin cream but still has some burning.  I will try metrogel.    Microhematuria.   Negative w/u.  Fluconazole sent should the cipro cause a yeast infection.    Meds ordered this encounter  Medications   ciprofloxacin (CIPRO) tablet 500 mg   fluconazole (DIFLUCAN) 150 MG tablet    Sig: Take 1 tablet (150 mg total) by mouth once for 1 dose.    Dispense:  1 tablet    Refill:  1   metroNIDAZOLE (METROGEL) 0.75 % vaginal gel    Sig: Place 1 Applicatorful vaginally at bedtime.    Dispense:  70 g    Refill:  1      Orders Placed This Encounter  Procedures   Microscopic Examination   Urinalysis, Routine w reflex microscopic     Return in about 3 months (around 12/27/2021).    CC: Dr. Precious Haws.      Irine Seal 09/29/2021 [id number] Patient ID: Sherry [name], female   DOB: September 04, 1970, 51 y.o.   MRN: [id number]

## 2021-12-17 ENCOUNTER — Other Ambulatory Visit: Payer: Self-pay

## 2021-12-17 ENCOUNTER — Ambulatory Visit
Admission: EM | Admit: 2021-12-17 | Discharge: 2021-12-17 | Disposition: A | Discharge status: Home / Self Care | Payer: Federal, State, Local not specified - PPO | Attending: Family Medicine | Admitting: Family Medicine

## 2021-12-17 ENCOUNTER — Encounter: Payer: Self-pay | Admitting: Emergency Medicine

## 2021-12-17 ENCOUNTER — Ambulatory Visit (INDEPENDENT_AMBULATORY_CARE_PROVIDER_SITE_OTHER): Payer: Federal, State, Local not specified - PPO

## 2021-12-17 DIAGNOSIS — J4521 Mild intermittent asthma with (acute) exacerbation: Secondary | ICD-10-CM | POA: Diagnosis not present

## 2021-12-17 DIAGNOSIS — J22 Unspecified acute lower respiratory infection: Secondary | ICD-10-CM | POA: Diagnosis not present

## 2021-12-17 DIAGNOSIS — R059 Cough, unspecified: Secondary | ICD-10-CM | POA: Diagnosis not present

## 2021-12-17 MED ORDER — DEXAMETHASONE SODIUM PHOSPHATE 10 MG/ML IJ SOLN
10.0000 mg | Freq: Once | INTRAMUSCULAR | Status: AC
  Administered 2021-12-17: 10 mg via INTRAMUSCULAR

## 2021-12-17 MED ORDER — ALBUTEROL SULFATE HFA 108 (90 BASE) MCG/ACT IN AERS
1.0000 | INHALATION_SPRAY | Freq: Four times a day (QID) | RESPIRATORY_TRACT | 0 refills | Status: DC | PRN
Start: 2021-12-17 — End: 2023-09-07

## 2021-12-17 MED ORDER — PREDNISONE 20 MG PO TABS: 40.0000 mg | ORAL_TABLET | Freq: Every day | ORAL | 0 refills | Status: AC

## 2021-12-17 MED ORDER — IPRATROPIUM-ALBUTEROL 0.5-2.5 (3) MG/3ML IN SOLN
3.0000 mL | Freq: Once | RESPIRATORY_TRACT | Status: AC
  Administered 2021-12-17: 3 mL via RESPIRATORY_TRACT

## 2021-12-17 MED ORDER — PROMETHAZINE-DM 6.25-15 MG/5ML PO SYRP: 5.0000 mL | ORAL_SOLUTION | Freq: Four times a day (QID) | ORAL | 0 refills | Status: AC | PRN

## 2021-12-17 MED ORDER — AZITHROMYCIN 250 MG PO TABS: ORAL_TABLET | ORAL | 0 refills | Status: DC

## 2021-12-17 NOTE — ED Triage Notes (Signed)
Pt reports productive cough since 4/7. Pt reports has gotten worse since Tuesday. Pt reports had some leftover prednisone and amoxicillin that started on 4/8 and finished on Tuesday.  ?

## 2021-12-21 NOTE — ED Provider Notes (Signed)
?[facility] ? ? ? ?CSN: [id number] ?Arrival date & time: 12/17/21  1129 ? ? ?  ? ?History   ?Chief Complaint ?Chief Complaint  ?Patient presents with  ? Cough  ? ? ?HPI ?Sherry [name] is a 51 y.o. female.  ? ?Presenting today with ongoing productive cough, fatigue, wheezing, chest tightness for the past 2 to 3 weeks.  Had some leftover prednisone and amoxicillin that she has been taking for the past week or so with no significant resolution of her symptoms.  States things worsened several days ago with her symptoms.  Denies fever, chills, sore throat, abdominal pain, nausea vomiting or diarrhea.  History of seasonal allergies, asthma.  No known sick contacts recently. ? ? ?Past Medical History:  ?Diagnosis Date  ? Allergy   ? Arthritis   ? Asthma   ? Back pain   ? Costochondritis   ? Depression   ? GERD (gastroesophageal reflux disease)   ? Headache   ? Kidney stones   ? Pityriasis rosea   ? ? ?There are no problems to display for this patient. ? ? ?Past Surgical History:  ?Procedure Laterality Date  ? ABDOMINAL HYSTERECTOMY    ? kidney stones    ? LAPAROSCOPY    ? LEEP    ? TONSILLECTOMY    ? WISDOM TOOTH EXTRACTION    ? ? ?OB History   ?No obstetric history on file. ?  ? ? ? ?Home Medications   ? ?Prior to Admission medications   ?Medication Sig Start Date End Date Taking? Authorizing Provider  ?albuterol (VENTOLIN HFA) 108 (90 Base) MCG/ACT inhaler Inhale 1-2 puffs into the lungs every 6 (six) hours as needed for wheezing or shortness of breath. 12/17/21  Yes Particia Nearing, PA-C  ?azithromycin (ZITHROMAX) 250 MG tablet Take first 2 tablets together, then 1 every day until finished. 12/17/21  Yes Particia Nearing, PA-C  ?predniSONE (DELTASONE) 20 MG tablet Take 2 tablets (40 mg total) by mouth daily with breakfast. 12/17/21  Yes Particia Nearing, PA-C  ?promethazine-dextromethorphan (PROMETHAZINE-DM) 6.25-15 MG/5ML syrup Take 5 mLs by mouth 4 (four) times daily as needed. 12/17/21   Yes Particia Nearing, PA-C  ?ALPRAZolam (XANAX) 0.5 MG tablet Take 1 tablet (0.5 mg total) by mouth 2 (two) times daily as needed for sleep. Needs office visit (second notice) ?Patient not taking: Reported on 08/01/2021 04/04/12   Nelva Nay, PA-C  ?aspirin 325 MG tablet Take 325 mg by mouth daily as needed.    [provider]  ?diphenhydrAMINE (BENADRYL) 50 MG tablet Take 1 tablet (50 mg total) by mouth once for 1 dose. Take 1 hour prior to CT scan 09/14/21 09/14/21  Bjorn Pippin, MD  ?EPINEPHrine (EPIPEN 2-PAK) 0.3 mg/0.3 mL SOAJ injection Inject 0.3 mLs (0.3 mg total) into the muscle once. 11/03/13   Nelva Nay, PA-C  ?fexofenadine-pseudoephedrine (ALLEGRA-D) 60-120 MG per tablet Take 1 tablet by mouth every morning.     [provider]  ?FLUoxetine (PROZAC) 40 MG capsule Take 40 mg by mouth daily.      [provider]  ?ibuprofen (ADVIL,MOTRIN) 800 MG tablet Take 800 mg by mouth as needed for pain.    [provider]  ?methocarbamol (ROBAXIN) 500 MG tablet Take 500 mg by mouth every 8 (eight) hours as needed for muscle spasms.     [provider]  ?metroNIDAZOLE (METROGEL) 0.75 % vaginal gel Place 1 Applicatorful vaginally at bedtime. 09/29/21   Bjorn Pippin, MD  ?  nitrofurantoin, macrocrystal-monohydrate, (MACROBID) 100 MG capsule 1 po prn following sexual activity ?Patient not taking: Reported on 08/01/2021 06/16/21   Bjorn PippinWrenn, John, MD  ?omeprazole (PRILOSEC) 20 MG capsule Take 20 mg by mouth daily as needed (indigestion).    [provider]  ?Oxycodone-Acetaminophen (PERCOCET PO) Take by mouth.    [provider]  ?predniSONE (DELTASONE) 50 MG tablet Take 1 tablet by mouth 13 hours prior to CT ?Then take 1 tablet by mouth 7 hours prior to CT ?Then take 1 tablet by mouth 1 hour prior to CT 09/15/21   Bjorn PippinWrenn, John, MD  ?PREMARIN vaginal cream Use 0.5gm nightly vaginally for 2 weeks and then 2-3x weekly. 06/16/21   Bjorn PippinWrenn, John, MD  ?ranitidine  (ZANTAC) 150 MG tablet take 1 tablet by mouth twice a day if needed for ACID REFLUX 01/18/14   Godfrey PickEgan, Eleanore E, PA-C  ?topiramate (TOPAMAX) 25 MG tablet Take 25 mg by mouth 2 (two) times daily.    [provider]  ? ? ?Family History ?Family History  ?Problem Relation Age of Onset  ? Hypertension Mother   ? ? ?Social History ?Social History  ? ?Tobacco Use  ? Smoking status: Former  ?  Types: E-cigarettes  ? Smokeless tobacco: Current  ?Substance Use Topics  ? Alcohol use: Yes  ?  Comment: occasional  ? Drug use: No  ? ? ? ?Allergies   ?Latex, Buspar [buspirone], and Iohexol ? ? ?Review of Systems ?Review of Systems ?Per HPI ? ?Physical Exam ?Triage Vital Signs ?ED Triage Vitals  ?Enc Vitals Group  ?   BP 12/17/21 1313 128/85  ?   Pulse Rate 12/17/21 1313 85  ?   Resp 12/17/21 1313 18  ?   Temp 12/17/21 1313 98.8 ?F (37.1 ?C)  ?   Temp Source 12/17/21 1313 Oral  ?   SpO2 12/17/21 1313 95 %  ?   Weight 12/17/21 1314 200 lb (90.7 kg)  ?   Height 12/17/21 1314 5\' 6"  (1.676 m)  ?   Head Circumference --   ?   Peak Flow --   ?   Pain Score 12/17/21 1313 7  ?   Pain Loc --   ?   Pain Edu? --   ?   Excl. in GC? --   ? ?No data found. ? ?Updated Vital Signs ?BP 128/85 (BP Location: Right Arm)   Pulse 85   Temp 98.8 ?F (37.1 ?C) (Oral)   Resp 18   Ht 5\' 6"  (1.676 m)   Wt 200 lb (90.7 kg)   SpO2 95%   BMI 32.28 kg/m?  ? ?Visual Acuity ?Right Eye Distance:   ?Left Eye Distance:   ?Bilateral Distance:   ? ?Right Eye Near:   ?Left Eye Near:    ?Bilateral Near:    ? ?Physical Exam ?Vitals and nursing note reviewed.  ?Constitutional:   ?   Appearance: Normal appearance.  ?HENT:  ?   Head: Atraumatic.  ?   Right Ear: Tympanic membrane and external ear normal.  ?   Left Ear: Tympanic membrane and external ear normal.  ?   Nose: Congestion present.  ?   Mouth/Throat:  ?   Mouth: Mucous membranes are moist.  ?   Pharynx: Posterior oropharyngeal erythema present.  ?Eyes:  ?   Extraocular Movements: Extraocular movements  intact.  ?   Conjunctiva/sclera: Conjunctivae normal.  ?Cardiovascular:  ?   Rate and Rhythm: Normal rate and regular rhythm.  ?  Heart sounds: Normal heart sounds.  ?Pulmonary:  ?   Effort: Pulmonary effort is normal.  ?   Breath sounds: Wheezing present. No rales.  ?   Comments: Moderate wheezes diffusely ?Musculoskeletal:     ?   General: Normal range of motion.  ?   Cervical back: Normal range of motion and neck supple.  ?Skin: ?   General: Skin is warm and dry.  ?Neurological:  ?   Mental Status: She is alert and oriented to person, place, and time.  ?Psychiatric:     ?   Mood and Affect: Mood normal.     ?   Thought Content: Thought content normal.  ? ? ? ?UC Treatments / Results  ?Labs ?(all labs ordered are listed, but only abnormal results are displayed) ?Labs Reviewed - No data to display ? ?EKG ? ? ?Radiology ?No results found. ? ?Procedures ?Procedures (including critical care time) ? ?Medications Ordered in UC ?Medications  ?ipratropium-albuterol (DUONEB) 0.5-2.5 (3) MG/3ML nebulizer solution 3 mL (3 mLs Nebulization Given 12/17/21 1344)  ?dexamethasone (DECADRON) injection 10 mg (10 mg Intramuscular Given 12/17/21 1403)  ? ? ?Initial Impression / Assessment and Plan / UC Course  ?I have reviewed the triage vital signs and the nursing notes. ? ?Pertinent labs & imaging results that were available during my care of the patient were reviewed by me and considered in my medical decision making (see chart for details). ? ?  ? ?Mild improvement with albuterol nebulizer treatment in clinic, IM Decadron, azithromycin, prednisone, Phenergan DM, albuterol sent to pharmacy.  Discussed supportive care, return precautions. ? ?Final Clinical Impressions(s) / UC Diagnoses  ? ?Final diagnoses:  ?Lower respiratory infection  ?Mild intermittent asthma with acute exacerbation  ? ?Discharge Instructions   ?None ?  ? ?ED Prescriptions   ? ? Medication Sig Dispense Auth. Provider  ? azithromycin (ZITHROMAX) 250 MG tablet Take  first 2 tablets together, then 1 every day until finished. 6 tablet Particia Nearing, [place]  ? predniSONE (DELTASONE) 20 MG tablet Take 2 tablets (40 mg total) by mouth daily with breakfast. 14 tablet Maurice March,

## 2021-12-27 ENCOUNTER — Ambulatory Visit: Payer: Self-pay

## 2022-01-05 ENCOUNTER — Ambulatory Visit: Payer: Federal, State, Local not specified - PPO | Admitting: Urology

## 2022-02-15 ENCOUNTER — Other Ambulatory Visit (HOSPITAL_COMMUNITY): Payer: Self-pay

## 2022-02-15 MED ORDER — OXYCODONE HCL 10 MG PO TABS
ORAL_TABLET | ORAL | 0 refills | Status: AC
  Filled 2022-02-15: qty 150, 30d supply, fill #0

## 2022-07-26 ENCOUNTER — Other Ambulatory Visit (HOSPITAL_COMMUNITY): Payer: Self-pay

## 2022-07-26 MED ORDER — OXYCODONE HCL 10 MG PO TABS
10.0000 mg | ORAL_TABLET | Freq: Every day | ORAL | 0 refills | Status: DC | PRN
  Filled 2022-07-26: qty 150, 30d supply, fill #0

## 2022-08-09 ENCOUNTER — Other Ambulatory Visit (HOSPITAL_COMMUNITY): Payer: Self-pay

## 2022-08-09 MED ORDER — METHOCARBAMOL 750 MG PO TABS
750.0000 mg | ORAL_TABLET | Freq: Two times a day (BID) | ORAL | 0 refills | Status: AC | PRN
  Filled 2022-08-09: qty 180, 90d supply, fill #0

## 2022-08-09 MED ORDER — TOPIRAMATE 25 MG PO TABS
25.0000 mg | ORAL_TABLET | Freq: Two times a day (BID) | ORAL | 1 refills | Status: AC
  Filled 2022-08-09 – 2022-11-21 (×2): qty 60, 30d supply, fill #0
  Filled 2023-04-16: qty 60, 30d supply, fill #1

## 2022-08-09 MED ORDER — OXYCODONE HCL 10 MG PO TABS
10.0000 mg | ORAL_TABLET | Freq: Every day | ORAL | 0 refills | Status: DC | PRN
  Filled 2022-09-25: qty 150, 30d supply, fill #0

## 2022-08-09 MED ORDER — OXYCODONE HCL 10 MG PO TABS
10.0000 mg | ORAL_TABLET | Freq: Every day | ORAL | 0 refills | Status: AC | PRN
  Filled 2022-08-09 – 2022-08-25 (×2): qty 150, 30d supply, fill #0

## 2022-08-10 ENCOUNTER — Other Ambulatory Visit (HOSPITAL_COMMUNITY): Payer: Self-pay

## 2022-08-24 ENCOUNTER — Other Ambulatory Visit (HOSPITAL_COMMUNITY): Payer: Self-pay

## 2022-08-25 ENCOUNTER — Other Ambulatory Visit (HOSPITAL_COMMUNITY): Payer: Self-pay

## 2022-09-25 ENCOUNTER — Other Ambulatory Visit (HOSPITAL_COMMUNITY): Payer: Self-pay

## 2022-10-12 ENCOUNTER — Other Ambulatory Visit (HOSPITAL_COMMUNITY): Payer: Self-pay

## 2022-10-12 MED ORDER — FLUCONAZOLE 150 MG PO TABS
150.0000 mg | ORAL_TABLET | ORAL | 0 refills | Status: DC
  Filled 2022-10-12: qty 3, 7d supply, fill #0

## 2022-10-18 ENCOUNTER — Other Ambulatory Visit (HOSPITAL_COMMUNITY): Payer: Self-pay

## 2022-10-18 MED ORDER — OXYCODONE HCL 10 MG PO TABS
10.0000 mg | ORAL_TABLET | Freq: Every day | ORAL | 0 refills | Status: AC | PRN
  Filled 2022-10-18: qty 150, 30d supply, fill #0
  Filled 2022-10-23: qty 150, 32d supply, fill #0
  Filled 2022-10-26: qty 150, 30d supply, fill #0

## 2022-10-18 MED ORDER — METHOCARBAMOL 750 MG PO TABS
750.0000 mg | ORAL_TABLET | Freq: Two times a day (BID) | ORAL | 0 refills | Status: AC | PRN
  Filled 2022-10-18 – 2022-10-26 (×2): qty 180, 90d supply, fill #0

## 2022-10-18 MED ORDER — OXYCODONE HCL 10 MG PO TABS
10.0000 mg | ORAL_TABLET | Freq: Every day | ORAL | 0 refills | Status: DC | PRN
  Filled 2022-11-21: qty 150, 30d supply, fill #0

## 2022-10-18 MED ORDER — TOPIRAMATE 25 MG PO TABS
25.0000 mg | ORAL_TABLET | Freq: Two times a day (BID) | ORAL | 1 refills | Status: AC
  Filled 2022-10-18 – 2022-10-26 (×2): qty 60, 30d supply, fill #0
  Filled 2023-06-18: qty 60, 30d supply, fill #1

## 2022-10-23 ENCOUNTER — Other Ambulatory Visit: Payer: Self-pay

## 2022-10-26 ENCOUNTER — Other Ambulatory Visit (HOSPITAL_COMMUNITY): Payer: Self-pay

## 2022-11-21 ENCOUNTER — Other Ambulatory Visit (HOSPITAL_COMMUNITY): Payer: Self-pay

## 2022-12-14 ENCOUNTER — Other Ambulatory Visit (HOSPITAL_COMMUNITY): Payer: Self-pay

## 2022-12-14 MED ORDER — OXYCODONE HCL 10 MG PO TABS
10.0000 mg | ORAL_TABLET | Freq: Every day | ORAL | 0 refills | Status: AC | PRN
  Filled 2022-12-22: qty 150, 30d supply, fill #0

## 2022-12-14 MED ORDER — METHOCARBAMOL 750 MG PO TABS
750.0000 mg | ORAL_TABLET | Freq: Two times a day (BID) | ORAL | 0 refills | Status: AC | PRN
  Filled 2023-03-19 – 2023-04-16 (×2): qty 180, 90d supply, fill #0

## 2022-12-14 MED ORDER — OXYCODONE HCL 10 MG PO TABS
10.0000 mg | ORAL_TABLET | Freq: Every day | ORAL | 0 refills | Status: DC | PRN
  Filled 2023-01-20: qty 150, 30d supply, fill #0

## 2022-12-14 MED ORDER — TOPIRAMATE 25 MG PO TABS
25.0000 mg | ORAL_TABLET | Freq: Two times a day (BID) | ORAL | 1 refills | Status: AC
  Filled 2022-12-14 – 2022-12-22 (×2): qty 60, 30d supply, fill #0
  Filled 2023-11-12: qty 60, 30d supply, fill #1

## 2022-12-18 ENCOUNTER — Other Ambulatory Visit (HOSPITAL_COMMUNITY): Payer: Self-pay

## 2022-12-22 ENCOUNTER — Other Ambulatory Visit (HOSPITAL_COMMUNITY): Payer: Self-pay

## 2023-01-03 ENCOUNTER — Other Ambulatory Visit (HOSPITAL_COMMUNITY): Payer: Self-pay

## 2023-01-20 ENCOUNTER — Other Ambulatory Visit (HOSPITAL_COMMUNITY): Payer: Self-pay

## 2023-01-23 ENCOUNTER — Other Ambulatory Visit (HOSPITAL_COMMUNITY): Payer: Self-pay

## 2023-01-23 ENCOUNTER — Other Ambulatory Visit: Payer: Self-pay

## 2023-02-12 ENCOUNTER — Other Ambulatory Visit (HOSPITAL_COMMUNITY): Payer: Self-pay

## 2023-02-12 MED ORDER — OXYCODONE HCL 10 MG PO TABS
10.0000 mg | ORAL_TABLET | Freq: Every day | ORAL | 0 refills | Status: AC | PRN
  Filled 2023-03-17 – 2023-03-19 (×3): qty 150, 30d supply, fill #0

## 2023-02-12 MED ORDER — TOPIRAMATE 25 MG PO TABS
25.0000 mg | ORAL_TABLET | Freq: Two times a day (BID) | ORAL | 1 refills | Status: AC
  Filled 2023-02-12: qty 60, 30d supply, fill #0
  Filled 2023-03-19: qty 60, 30d supply, fill #1

## 2023-02-12 MED ORDER — OXYCODONE HCL 10 MG PO TABS
10.0000 mg | ORAL_TABLET | Freq: Every day | ORAL | 0 refills | Status: AC | PRN
  Filled 2023-02-19: qty 150, 30d supply, fill #0

## 2023-02-12 MED ORDER — METHOCARBAMOL 750 MG PO TABS
750.0000 mg | ORAL_TABLET | Freq: Two times a day (BID) | ORAL | 0 refills | Status: AC | PRN
  Filled 2023-02-12: qty 180, 90d supply, fill #0

## 2023-02-13 ENCOUNTER — Other Ambulatory Visit (HOSPITAL_COMMUNITY): Payer: Self-pay

## 2023-02-19 ENCOUNTER — Other Ambulatory Visit (HOSPITAL_COMMUNITY): Payer: Self-pay

## 2023-02-20 ENCOUNTER — Other Ambulatory Visit (HOSPITAL_COMMUNITY): Payer: Self-pay

## 2023-03-17 ENCOUNTER — Other Ambulatory Visit (HOSPITAL_COMMUNITY): Payer: Self-pay

## 2023-03-19 ENCOUNTER — Other Ambulatory Visit (HOSPITAL_COMMUNITY): Payer: Self-pay

## 2023-03-19 ENCOUNTER — Other Ambulatory Visit: Payer: Self-pay

## 2023-03-19 ENCOUNTER — Other Ambulatory Visit (HOSPITAL_BASED_OUTPATIENT_CLINIC_OR_DEPARTMENT_OTHER): Payer: Self-pay

## 2023-03-20 ENCOUNTER — Other Ambulatory Visit (HOSPITAL_COMMUNITY): Payer: Self-pay

## 2023-04-16 ENCOUNTER — Other Ambulatory Visit (HOSPITAL_COMMUNITY): Payer: Self-pay

## 2023-04-16 ENCOUNTER — Other Ambulatory Visit: Payer: Self-pay

## 2023-04-16 MED ORDER — OXYCODONE HCL 10 MG PO TABS
10.0000 mg | ORAL_TABLET | Freq: Every day | ORAL | 0 refills | Status: AC | PRN
  Filled 2023-04-16: qty 150, 30d supply, fill #0

## 2023-04-16 MED ORDER — OXYCODONE HCL 10 MG PO TABS
10.0000 mg | ORAL_TABLET | Freq: Every day | ORAL | 0 refills | Status: AC | PRN
  Filled 2023-05-16: qty 150, 30d supply, fill #0

## 2023-04-16 MED ORDER — METHOCARBAMOL 750 MG PO TABS
750.0000 mg | ORAL_TABLET | Freq: Two times a day (BID) | ORAL | 0 refills | Status: AC | PRN
  Filled 2023-04-16 – 2023-05-16 (×2): qty 180, 90d supply, fill #0

## 2023-04-16 MED ORDER — TOPIRAMATE 25 MG PO TABS
25.0000 mg | ORAL_TABLET | Freq: Two times a day (BID) | ORAL | 0 refills | Status: AC
  Filled 2023-04-16 – 2023-05-16 (×2): qty 180, 90d supply, fill #0

## 2023-04-18 ENCOUNTER — Other Ambulatory Visit (HOSPITAL_COMMUNITY): Payer: Self-pay

## 2023-04-21 ENCOUNTER — Other Ambulatory Visit (HOSPITAL_COMMUNITY): Payer: Self-pay

## 2023-05-01 ENCOUNTER — Other Ambulatory Visit (HOSPITAL_COMMUNITY): Payer: Self-pay

## 2023-05-17 ENCOUNTER — Other Ambulatory Visit (HOSPITAL_COMMUNITY): Payer: Self-pay

## 2023-05-21 ENCOUNTER — Other Ambulatory Visit (HOSPITAL_COMMUNITY): Payer: Self-pay

## 2023-06-13 ENCOUNTER — Other Ambulatory Visit (HOSPITAL_COMMUNITY): Payer: Self-pay

## 2023-06-13 MED ORDER — OXYCODONE HCL 10 MG PO TABS
10.0000 mg | ORAL_TABLET | Freq: Every day | ORAL | 0 refills | Status: DC | PRN
Start: 2023-07-12 — End: 2023-08-15
  Filled 2023-07-17: qty 150, 30d supply, fill #0

## 2023-06-13 MED ORDER — TOPIRAMATE 25 MG PO TABS
25.0000 mg | ORAL_TABLET | Freq: Two times a day (BID) | ORAL | 0 refills | Status: AC
  Filled 2023-06-13 – 2023-09-17 (×2): qty 180, 90d supply, fill #0

## 2023-06-13 MED ORDER — METHOCARBAMOL 750 MG PO TABS
750.0000 mg | ORAL_TABLET | Freq: Two times a day (BID) | ORAL | 0 refills | Status: AC | PRN
  Filled 2023-06-13: qty 180, 90d supply, fill #0

## 2023-06-13 MED ORDER — OXYCODONE HCL 10 MG PO TABS
10.0000 mg | ORAL_TABLET | Freq: Every day | ORAL | 0 refills | Status: AC | PRN
  Filled 2023-06-13 – 2023-06-18 (×3): qty 150, 30d supply, fill #0

## 2023-06-14 ENCOUNTER — Other Ambulatory Visit (HOSPITAL_COMMUNITY): Payer: Self-pay

## 2023-06-18 ENCOUNTER — Other Ambulatory Visit: Payer: Self-pay

## 2023-06-18 ENCOUNTER — Other Ambulatory Visit (HOSPITAL_COMMUNITY): Payer: Self-pay

## 2023-07-17 ENCOUNTER — Other Ambulatory Visit (HOSPITAL_COMMUNITY): Payer: Self-pay

## 2023-08-15 ENCOUNTER — Other Ambulatory Visit (HOSPITAL_COMMUNITY): Payer: Self-pay

## 2023-08-15 MED ORDER — METHOCARBAMOL 750 MG PO TABS
750.0000 mg | ORAL_TABLET | Freq: Two times a day (BID) | ORAL | 0 refills | Status: AC | PRN
  Filled 2023-08-15: qty 180, 90d supply, fill #0

## 2023-08-15 MED ORDER — OXYCODONE HCL 10 MG PO TABS
10.0000 mg | ORAL_TABLET | Freq: Every day | ORAL | 0 refills | Status: AC | PRN
  Filled 2023-08-15: qty 150, 30d supply, fill #0

## 2023-08-15 MED ORDER — TOPIRAMATE 25 MG PO TABS
25.0000 mg | ORAL_TABLET | Freq: Two times a day (BID) | ORAL | 0 refills | Status: AC
  Filled 2023-08-15 – 2023-08-16 (×2): qty 180, 90d supply, fill #0

## 2023-08-15 MED ORDER — OXYCODONE HCL 10 MG PO TABS
10.0000 mg | ORAL_TABLET | Freq: Every day | ORAL | 0 refills | Status: AC | PRN
  Filled 2023-09-17: qty 150, 32d supply, fill #0

## 2023-08-16 ENCOUNTER — Other Ambulatory Visit (HOSPITAL_COMMUNITY): Payer: Self-pay

## 2023-09-07 ENCOUNTER — Ambulatory Visit
Admission: RE | Admit: 2023-09-07 | Discharge: 2023-09-07 | Disposition: A | Discharge status: Home / Self Care | Payer: Federal, State, Local not specified - PPO | Source: Ambulatory Visit | Attending: Family Medicine | Admitting: Family Medicine

## 2023-09-07 VITALS — BP 133/80 | HR 100 | Temp 98.3°F | Resp 18

## 2023-09-07 DIAGNOSIS — J22 Unspecified acute lower respiratory infection: Secondary | ICD-10-CM

## 2023-09-07 DIAGNOSIS — J4521 Mild intermittent asthma with (acute) exacerbation: Secondary | ICD-10-CM | POA: Diagnosis not present

## 2023-09-07 DIAGNOSIS — R509 Fever, unspecified: Secondary | ICD-10-CM

## 2023-09-07 MED ORDER — DEXAMETHASONE SODIUM PHOSPHATE 10 MG/ML IJ SOLN
10.0000 mg | Freq: Once | INTRAMUSCULAR | Status: AC
  Administered 2023-09-07: 10 mg via INTRAMUSCULAR

## 2023-09-07 MED ORDER — ALBUTEROL SULFATE HFA 108 (90 BASE) MCG/ACT IN AERS: 2.0000 | INHALATION_SPRAY | RESPIRATORY_TRACT | 0 refills | Status: DC | PRN

## 2023-09-07 MED ORDER — AZITHROMYCIN 250 MG PO TABS: ORAL_TABLET | ORAL | 0 refills | Status: DC

## 2023-09-07 MED ORDER — FLUCONAZOLE 150 MG PO TABS: 150.0000 mg | ORAL_TABLET | ORAL | 0 refills | Status: DC

## 2023-09-07 MED ORDER — PROMETHAZINE-DM 6.25-15 MG/5ML PO SYRP: 5.0000 mL | ORAL_SOLUTION | Freq: Four times a day (QID) | ORAL | 0 refills | Status: AC | PRN

## 2023-09-07 NOTE — ED Provider Notes (Signed)
 [facility]    CSN: [id number] Arrival date & time: 09/07/23  1701      History   Chief Complaint Chief Complaint  Patient presents with   Wheezing    Chest tightness - Entered by patient    HPI Sherry [name] is a 53 y.o. female.   Presenting today with over 2 weeks of progressively worsening productive cough, chest tightness worse on the right, wheezing, shortness of breath, intermittent fevers.  States she tried a course of amoxicillin  and prednisone  a week ago, has 1 dose left of the antibiotic and initially thought it was improving her symptoms but now symptoms have become worse again.  Does have an albuterol  inhaler at home that she has been taking as needed with very mild temporary benefit.  No known sick contacts recently.    Past Medical History:  Diagnosis Date   Allergy    Arthritis    Asthma    Back pain    Costochondritis    Depression    GERD (gastroesophageal reflux disease)    Headache    Kidney stones    Pityriasis rosea     There are no active problems to display for this patient.   Past Surgical History:  Procedure Laterality Date   ABDOMINAL HYSTERECTOMY     kidney stones     LAPAROSCOPY     LEEP     TONSILLECTOMY     WISDOM TOOTH EXTRACTION      OB History   No obstetric history on file.      Home Medications    Prior to Admission medications   Medication Sig Start Date End Date Taking? Authorizing Provider  azithromycin  (ZITHROMAX ) 250 MG tablet Take first 2 tablets together, then 1 every day until finished. 09/07/23  Yes Stuart Vernell Norris, PA-C  promethazine -dextromethorphan (PROMETHAZINE -DM) 6.25-15 MG/5ML syrup Take 5 mLs by mouth 4 (four) times daily as needed. 09/07/23  Yes Stuart Vernell Norris, PA-C  albuterol  (VENTOLIN  HFA) 108 [phone] Base) MCG/ACT inhaler Inhale 2 puffs into the lungs every 4 (four) hours as needed for wheezing or shortness of breath. 09/07/23   Stuart Vernell Norris, PA-C  ALPRAZolam  (XANAX) 0.5 MG tablet Take 1 tablet (0.5 mg total) by mouth 2 (two) times daily as needed for sleep. Needs office visit (second notice) Patient not taking: Reported on 08/01/2021 04/04/12   Hadassah Powell HERO, PA-C  aspirin 325 MG tablet Take 325 mg by mouth daily as needed.    [provider]  azithromycin  (ZITHROMAX ) 250 MG tablet Take first 2 tablets together, then 1 every day until finished. 12/17/21   Stuart Vernell Norris, PA-C  diphenhydrAMINE  (BENADRYL ) 50 MG tablet Take 1 tablet (50 mg total) by mouth once for 1 dose. Take 1 hour prior to CT scan 09/14/21 09/14/21  Watt Rush, MD  EPINEPHrine  (EPIPEN  2-PAK) 0.3 mg/0.3 mL SOAJ injection Inject 0.3 mLs (0.3 mg total) into the muscle once. 11/03/13   Hadassah Powell HERO, PA-C  fexofenadine -pseudoephedrine  (ALLEGRA-D) 60-120 MG per tablet Take 1 tablet by mouth every morning.     [provider]  fluconazole  (DIFLUCAN ) 150 MG tablet Take 1 tablet (150 mg total) by mouth on days 1, 4 and 7 as needed. 10/12/22     FLUoxetine  (PROZAC ) 40 MG capsule Take 40 mg by mouth daily.      [provider]  ibuprofen (ADVIL,MOTRIN) 800 MG tablet Take 800 mg by mouth as needed for pain.    [provider]  methocarbamol  (ROBAXIN ) 500 MG tablet Take 500 mg by mouth every 8 (eight) hours as needed for muscle spasms.     [provider]  methocarbamol  (ROBAXIN ) 750 MG tablet Take 1 tablet (750 mg total) by mouth 2 (two) times daily as needed for pain 08/09/22     methocarbamol  (ROBAXIN ) 750 MG tablet Take 1 tablet (750 mg total) by mouth 2 (two) times daily as needed for pain 10/18/22     methocarbamol  (ROBAXIN ) 750 MG tablet Take 1 tablet (750 mg total) by mouth 2 (two) times daily as needed for pain. 12/14/22     methocarbamol  (ROBAXIN ) 750 MG tablet Take 1 tablet (750 mg) by mouth 2 times daily as needed for pain 02/12/23     methocarbamol  (ROBAXIN ) 750 MG tablet Take 1 tablet (750 mg total) by mouth 2 (two) times daily as needed  for pain 04/16/23     methocarbamol  (ROBAXIN ) 750 MG tablet Take 1 tablet (750 mg total) by mouth 2 (two) times daily as needed for pain. 06/13/23     methocarbamol  (ROBAXIN ) 750 MG tablet Take 1 tablet (750 mg total) by mouth 2 (two) times daily as needed for pain. 08/15/23     metroNIDAZOLE  (METROGEL ) 0.75 % vaginal gel Place 1 Applicatorful vaginally at bedtime. 09/29/21   Watt Rush, MD  nitrofurantoin , macrocrystal-monohydrate, (MACROBID ) 100 MG capsule 1 po prn following sexual activity Patient not taking: Reported on 08/01/2021 06/16/21   Wrenn, John, MD  omeprazole (PRILOSEC) 20 MG capsule Take 20 mg by mouth daily as needed (indigestion).    [provider]  Oxycodone  HCl 10 MG TABS Take 1 tablet by mouth five times a day as needed for pain 02/15/22     Oxycodone  HCl 10 MG TABS Take 1 tablet (10 mg total) by mouth 5 (five) times daily as needed for pain. 08/09/22     Oxycodone  HCl 10 MG TABS Take 1 tablet (10 mg total) by mouth 5 (five) times daily as needed for pain 10/18/22     Oxycodone  HCl 10 MG TABS Take 1 tablet (10 mg total) by mouth 5 (five) times daily as needed for pain. 12/14/22     Oxycodone  HCl 10 MG TABS Take 1 tablet (10 mg) by mouth 5 times daily as needed for pain (fill 03/12/23) 03/12/23     Oxycodone  HCl 10 MG TABS Take 1 tablet (10 mg) by mouth 5 times daily as needed for pain 02/12/23     Oxycodone  HCl 10 MG TABS Take 1 tablet (10 mg total) by mouth 5 (five) times daily as needed for pain (fill 05/15/23) 05/15/23     Oxycodone  HCl 10 MG TABS Take 1 tablet (10 mg total) by mouth 5 (five) times daily as needed for pain 04/16/23     Oxycodone  HCl 10 MG TABS Take 1 tablet (10 mg total) by mouth 5 (five) times daily as needed for pain 06/13/23     Oxycodone  HCl 10 MG TABS Take 1 tablet (10 mg total) by mouth 5 (five) times daily as needed for pain. 08/15/23     Oxycodone  HCl 10 MG TABS Take 1 tablet (10 mg total) by mouth 5 (five) times daily as needed for pain. 09/13/23      Oxycodone -Acetaminophen (PERCOCET PO) Take by mouth.    [provider]  predniSONE  (DELTASONE ) 20 MG tablet Take 2 tablets (40 mg total) by mouth daily with breakfast. 12/17/21   Stuart Vernell Norris, PA-C  predniSONE  (DELTASONE ) 50 MG tablet Take  1 tablet by mouth 13 hours prior to CT Then take 1 tablet by mouth 7 hours prior to CT Then take 1 tablet by mouth 1 hour prior to CT 09/15/21   Watt Rush, MD  PREMARIN  vaginal cream Use 0.5gm nightly vaginally for 2 weeks and then 2-3x weekly. 06/16/21   Wrenn, John, MD  promethazine -dextromethorphan (PROMETHAZINE -DM) 6.25-15 MG/5ML syrup Take 5 mLs by mouth 4 (four) times daily as needed. 12/17/21   Stuart Vernell Norris, PA-C  ranitidine  (ZANTAC ) 150 MG tablet take 1 tablet by mouth twice a day if needed for ACID REFLUX 01/18/14   Egan, Eleanore E, PA-C  topiramate  (TOPAMAX ) 25 MG tablet Take 25 mg by mouth 2 (two) times daily.    [provider]  topiramate  (TOPAMAX ) 25 MG tablet Take 1 tablet (25 mg total) by mouth 2 (two) times daily. 08/09/22     topiramate  (TOPAMAX ) 25 MG tablet Take 1 tablet (25 mg total) by mouth 2 (two) times daily. 10/18/22     topiramate  (TOPAMAX ) 25 MG tablet Take 1 tablet (25 mg total) by mouth 2 (two) times daily. 12/14/22     topiramate  (TOPAMAX ) 25 MG tablet Take 1 tablet (25 mg) by mouth 2 times daily. 02/12/23     topiramate  (TOPAMAX ) 25 MG tablet Take 1 tablet (25 mg total) by mouth 2 (two) times daily. 04/16/23     topiramate  (TOPAMAX ) 25 MG tablet Take 1 tablet (25 mg total) by mouth 2 (two) times daily. 06/13/23     topiramate  (TOPAMAX ) 25 MG tablet Take 1 tablet (25 mg total) by mouth 2 (two) times daily. 08/15/23       Family History Family History  Problem Relation Age of Onset   Hypertension Mother     Social History Social History   Tobacco Use   Smoking status: Former    Types: E-cigarettes   Smokeless tobacco: Current  Substance Use Topics   Alcohol use: Yes    Comment:  occasional   Drug use: No     Allergies   Latex, Buspar [buspirone], and Iohexol    Review of Systems Review of Systems Per HPI  Physical Exam Triage Vital Signs ED Triage Vitals  Encounter Vitals Group     BP 09/07/23 1709 133/80     Systolic BP Percentile --      Diastolic BP Percentile --      Pulse Rate 09/07/23 1709 100     Resp 09/07/23 1709 18     Temp 09/07/23 1709 98.3 F (36.8 C)     Temp Source 09/07/23 1709 Oral     SpO2 09/07/23 1709 94 %     Weight --      Height --      Head Circumference --      Peak Flow --      Pain Score 09/07/23 1710 5     Pain Loc --      Pain Education --      Exclude from Growth Chart --    No data found.  Updated Vital Signs BP 133/80 (BP Location: Right Arm)   Pulse 100   Temp 98.3 F (36.8 C) (Oral)   Resp 18   SpO2 94%   Visual Acuity Right Eye Distance:   Left Eye Distance:   Bilateral Distance:    Right Eye Near:   Left Eye Near:    Bilateral Near:     Physical Exam Vitals and nursing note reviewed.  Constitutional:  Appearance: Normal appearance.  HENT:     Head: Atraumatic.     Right Ear: Tympanic membrane and external ear normal.     Left Ear: Tympanic membrane and external ear normal.     Nose: Congestion present.     Mouth/Throat:     Mouth: Mucous membranes are moist.     Pharynx: Posterior oropharyngeal erythema present.  Eyes:     Extraocular Movements: Extraocular movements intact.     Conjunctiva/sclera: Conjunctivae normal.  Cardiovascular:     Rate and Rhythm: Normal rate and regular rhythm.     Heart sounds: Normal heart sounds.  Pulmonary:     Effort: Pulmonary effort is normal.     Breath sounds: Wheezing present.  Musculoskeletal:        General: Normal range of motion.     Cervical back: Normal range of motion and neck supple.  Skin:    General: Skin is warm and dry.  Neurological:     Mental Status: She is alert and oriented to person, place, and time.  Psychiatric:         Mood and Affect: Mood normal.        Thought Content: Thought content normal.      UC Treatments / Results  Labs (all labs ordered are listed, but only abnormal results are displayed) Labs Reviewed - No data to display  EKG   Radiology No results found.  Procedures Procedures (including critical care time)  Medications Ordered in UC Medications  dexamethasone  (DECADRON ) injection 10 mg (has no administration in time range)    Initial Impression / Assessment and Plan / UC Course  I have reviewed the triage vital signs and the nursing notes.  Pertinent labs & imaging results that were available during my care of the patient were reviewed by me and considered in my medical decision making (see chart for details).     Given duration and worsening course, will cover for atypical bacteria with Zithromax , albuterol , IM Decadron  and supportive over-the-counter medications and home care.  Return for any worsening symptoms. Final Clinical Impressions(s) / UC Diagnoses   Final diagnoses:  Lower respiratory infection  Mild intermittent asthma with acute exacerbation  Fever, unspecified   Discharge Instructions   None    ED Prescriptions     Medication Sig Dispense Auth. Provider   azithromycin  (ZITHROMAX ) 250 MG tablet Take first 2 tablets together, then 1 every day until finished. 6 tablet Stuart Vernell Norris, PA-C   albuterol  (VENTOLIN  HFA) 108 (90 Base) MCG/ACT inhaler Inhale 2 puffs into the lungs every 4 (four) hours as needed for wheezing or shortness of breath. 18 g Stuart Vernell Norris, PA-C   promethazine -dextromethorphan (PROMETHAZINE -DM) 6.25-15 MG/5ML syrup Take 5 mLs by mouth 4 (four) times daily as needed. 100 mL Stuart Vernell Norris, [place]      PDMP not reviewed this encounter.   Stuart Vernell Norris, [place] 09/07/23 1723

## 2023-09-07 NOTE — ED Triage Notes (Signed)
 Pt reports she has chest tightness on right side and mucus x 2 days

## 2023-09-17 ENCOUNTER — Other Ambulatory Visit (HOSPITAL_COMMUNITY): Payer: Self-pay

## 2023-09-20 ENCOUNTER — Other Ambulatory Visit: Payer: Self-pay

## 2023-09-20 ENCOUNTER — Ambulatory Visit
Admission: RE | Admit: 2023-09-20 | Discharge: 2023-09-20 | Disposition: A | Discharge status: Home / Self Care | Payer: Federal, State, Local not specified - PPO | Source: Ambulatory Visit | Attending: Nurse Practitioner | Admitting: Nurse Practitioner

## 2023-09-20 VITALS — BP 123/79 | HR 71 | Temp 97.9°F | Resp 20

## 2023-09-20 DIAGNOSIS — Z1152 Encounter for screening for COVID-19: Secondary | ICD-10-CM | POA: Diagnosis present

## 2023-09-20 DIAGNOSIS — J069 Acute upper respiratory infection, unspecified: Secondary | ICD-10-CM | POA: Insufficient documentation

## 2023-09-20 DIAGNOSIS — K51211 Ulcerative (chronic) proctitis with rectal bleeding: Secondary | ICD-10-CM | POA: Diagnosis present

## 2023-09-20 LAB — POCT INFLUENZA A/B
Influenza A, POC: NEGATIVE
Influenza B, POC: NEGATIVE

## 2023-09-20 MED ORDER — METRONIDAZOLE 500 MG PO TABS: 500.0000 mg | ORAL_TABLET | Freq: Two times a day (BID) | ORAL | 0 refills | Status: AC

## 2023-09-20 MED ORDER — FLUCONAZOLE 150 MG PO TABS: 150.0000 mg | ORAL_TABLET | Freq: Once | ORAL | 0 refills | Status: AC

## 2023-09-20 NOTE — ED Triage Notes (Addendum)
Pt reports seen for similar earlier this month, pt reports continued cough that is worsening, chills. Reports colitis flare-up since Monday as well.

## 2023-09-20 NOTE — ED Provider Notes (Signed)
[facility]    CSN: [id number] Arrival date & time: 09/20/23  1657      History   Chief Complaint Chief Complaint  Patient presents with   Cough    I was just there on 1/10 for pneumonia and was much better until Monday 1/20 and I started coughing up mucous again- feels more like bronchitis now.Chest is a little congested, cough is getting worse. Very tired, runny nose, sore throat. Tried to cal - Entered by patient    HPI Sherry [name] is a 53 y.o. female.   Patient presents today with 2 day history of congested cough with discolored mucus, shortness of breath and wheezing that improves with albuterol inhaler, runny nose, sore throat, hedache, decreased appetite, and fatigue.  She denies known fevers, body aches/chills, chest pain, stuffy nose, and nausea/vomiting.  She was seen for similar symptoms on 09/07/2023 and was treated with azithromycin, albuterol inhaler, and promethazine-dextromethorphan cough syrup and reports symptoms had fully improved until they worsened again a couple of days ago.  She is having visitors come from out of town over the weekend and wants to make sure she is not contagious of anything before they stay with her.  She has taken OTC cough medication for symptoms without much improvement.   Also concerned about a "colitis flare" that she has been dealing with that began 4 days ago.  She reports she typically has 1-2 flares per year, however last one was 1 month ago.  She is typically treated by her Gastroenterologist for the flares and reports she will either receive doxycyline or metronidazole to help it resolve.  She endorses her typical "flare" symptoms which include intermittent lower abdominal pain, loose stools with frequent flatulence and mucusy stool, and blood in her stool.  She is not having more than 20 bowel movements daily and is not having purely watery stools.  She takes mesalamine daily for the colitits.  Last month, was treated with  doxycycline for the flare.    She is requesting antifungal vaginal yeast infection treatment if we treat with antibiotics today as she reports she is prone to them after taking antimicrobials.    Past Medical History:  Diagnosis Date   Allergy    Arthritis    Asthma    Back pain    Costochondritis    Depression    GERD (gastroesophageal reflux disease)    Headache    Kidney stones    Pityriasis rosea     There are no active problems to display for this patient.   Past Surgical History:  Procedure Laterality Date   ABDOMINAL HYSTERECTOMY     kidney stones     LAPAROSCOPY     LEEP     TONSILLECTOMY     WISDOM TOOTH EXTRACTION      OB History   No obstetric history on file.      Home Medications    Prior to Admission medications   Medication Sig Start Date End Date Taking? Authorizing Provider  metroNIDAZOLE (FLAGYL) 500 MG tablet Take 1 tablet (500 mg total) by mouth 2 (two) times daily for 10 days. 09/20/23 09/30/23 Yes Valentino Nose, NP  promethazine-dextromethorphan (PROMETHAZINE-DM) 6.25-15 MG/5ML syrup Take 5 mLs by mouth 4 (four) times daily as needed. 09/07/23  Yes Particia Nearing, PA-C  albuterol (VENTOLIN HFA) 108 (90 Base) MCG/ACT inhaler Inhale 2 puffs into the lungs every 4 (four) hours as needed for wheezing or shortness of breath. 09/07/23  Particia Nearing, PA-C  ALPRAZolam Prudy Feeler) 0.5 MG tablet Take 1 tablet (0.5 mg total) by mouth 2 (two) times daily as needed for sleep. Needs office visit (second notice) Patient not taking: Reported on 08/01/2021 04/04/12   Nelva Nay, PA-C  aspirin 325 MG tablet Take 325 mg by mouth daily as needed.    [provider]  diphenhydrAMINE (BENADRYL) 50 MG tablet Take 1 tablet (50 mg total) by mouth once for 1 dose. Take 1 hour prior to CT scan 09/14/21 09/14/21  Bjorn Pippin, MD  EPINEPHrine (EPIPEN 2-PAK) 0.3 mg/0.3 mL SOAJ injection Inject 0.3 mLs (0.3 mg total) into the muscle once. Patient  not taking: Reported on 09/20/2023 11/03/13   Nelva Nay, PA-C  fexofenadine-pseudoephedrine (ALLEGRA-D) 60-120 MG per tablet Take 1 tablet by mouth every morning.     [provider]  FLUoxetine (PROZAC) 40 MG capsule Take 40 mg by mouth daily.      [provider]  ibuprofen (ADVIL,MOTRIN) 800 MG tablet Take 800 mg by mouth as needed for pain.    [provider]  methocarbamol (ROBAXIN) 500 MG tablet Take 500 mg by mouth every 8 (eight) hours as needed for muscle spasms.     [provider]  methocarbamol (ROBAXIN) 750 MG tablet Take 1 tablet (750 mg total) by mouth 2 (two) times daily as needed for pain 08/09/22     methocarbamol (ROBAXIN) 750 MG tablet Take 1 tablet (750 mg total) by mouth 2 (two) times daily as needed for pain 10/18/22     methocarbamol (ROBAXIN) 750 MG tablet Take 1 tablet (750 mg total) by mouth 2 (two) times daily as needed for pain. 12/14/22     methocarbamol (ROBAXIN) 750 MG tablet Take 1 tablet (750 mg) by mouth 2 times daily as needed for pain 02/12/23     methocarbamol (ROBAXIN) 750 MG tablet Take 1 tablet (750 mg total) by mouth 2 (two) times daily as needed for pain 04/16/23     methocarbamol (ROBAXIN) 750 MG tablet Take 1 tablet (750 mg total) by mouth 2 (two) times daily as needed for pain. 06/13/23     methocarbamol (ROBAXIN) 750 MG tablet Take 1 tablet (750 mg total) by mouth 2 (two) times daily as needed for pain. 08/15/23     metroNIDAZOLE (METROGEL) 0.75 % vaginal gel Place 1 Applicatorful vaginally at bedtime. 09/29/21   Bjorn Pippin, MD  nitrofurantoin, macrocrystal-monohydrate, (MACROBID) 100 MG capsule 1 po prn following sexual activity Patient not taking: Reported on 08/01/2021 06/16/21   Bjorn Pippin, MD  omeprazole (PRILOSEC) 20 MG capsule Take 20 mg by mouth daily as needed (indigestion).    [provider]  Oxycodone HCl 10 MG TABS Take 1 tablet by mouth five times a day as needed for pain 02/15/22     Oxycodone  HCl 10 MG TABS Take 1 tablet (10 mg total) by mouth 5 (five) times daily as needed for pain. 08/09/22     Oxycodone HCl 10 MG TABS Take 1 tablet (10 mg total) by mouth 5 (five) times daily as needed for pain 10/18/22     Oxycodone HCl 10 MG TABS Take 1 tablet (10 mg total) by mouth 5 (five) times daily as needed for pain. 12/14/22     Oxycodone HCl 10 MG TABS Take 1 tablet (10 mg) by mouth 5 times daily as needed for pain (fill 03/12/23) 03/12/23     Oxycodone HCl 10 MG TABS Take 1 tablet (10 mg)  by mouth 5 times daily as needed for pain 02/12/23     Oxycodone HCl 10 MG TABS Take 1 tablet (10 mg total) by mouth 5 (five) times daily as needed for pain (fill 05/15/23) 05/15/23     Oxycodone HCl 10 MG TABS Take 1 tablet (10 mg total) by mouth 5 (five) times daily as needed for pain 04/16/23     Oxycodone HCl 10 MG TABS Take 1 tablet (10 mg total) by mouth 5 (five) times daily as needed for pain 06/13/23     Oxycodone HCl 10 MG TABS Take 1 tablet (10 mg total) by mouth 5 (five) times daily as needed for pain. 08/15/23     Oxycodone HCl 10 MG TABS Take 1 tablet (10 mg total) by mouth 5 (five) times daily as needed for pain. 09/13/23     Oxycodone-Acetaminophen (PERCOCET PO) Take by mouth.    [provider]  predniSONE (DELTASONE) 20 MG tablet Take 2 tablets (40 mg total) by mouth daily with breakfast. 12/17/21   Particia Nearing, PA-C  predniSONE (DELTASONE) 50 MG tablet Take 1 tablet by mouth 13 hours prior to CT Then take 1 tablet by mouth 7 hours prior to CT Then take 1 tablet by mouth 1 hour prior to CT 09/15/21   Bjorn Pippin, MD  PREMARIN vaginal cream Use 0.5gm nightly vaginally for 2 weeks and then 2-3x weekly. 06/16/21   Bjorn Pippin, MD  promethazine-dextromethorphan (PROMETHAZINE-DM) 6.25-15 MG/5ML syrup Take 5 mLs by mouth 4 (four) times daily as needed. 12/17/21   Particia Nearing, PA-C  ranitidine (ZANTAC) 150 MG tablet take 1 tablet by mouth twice a day if needed for ACID REFLUX  01/18/14   Godfrey Pick, PA-C  topiramate (TOPAMAX) 25 MG tablet Take 25 mg by mouth 2 (two) times daily.    [provider]  topiramate (TOPAMAX) 25 MG tablet Take 1 tablet (25 mg total) by mouth 2 (two) times daily. 08/09/22     topiramate (TOPAMAX) 25 MG tablet Take 1 tablet (25 mg total) by mouth 2 (two) times daily. 10/18/22     topiramate (TOPAMAX) 25 MG tablet Take 1 tablet (25 mg total) by mouth 2 (two) times daily. 12/14/22     topiramate (TOPAMAX) 25 MG tablet Take 1 tablet (25 mg) by mouth 2 times daily. 02/12/23     topiramate (TOPAMAX) 25 MG tablet Take 1 tablet (25 mg total) by mouth 2 (two) times daily. 04/16/23     topiramate (TOPAMAX) 25 MG tablet Take 1 tablet (25 mg total) by mouth 2 (two) times daily. 06/13/23     topiramate (TOPAMAX) 25 MG tablet Take 1 tablet (25 mg total) by mouth 2 (two) times daily. 08/15/23       Family History Family History  Problem Relation Age of Onset   Hypertension Mother     Social History Social History   Tobacco Use   Smoking status: Former    Types: E-cigarettes   Smokeless tobacco: Current  Substance Use Topics   Alcohol use: Yes    Comment: occasional   Drug use: No     Allergies   Latex, Buspar [buspirone], and Iohexol   Review of Systems Review of Systems Per HPI  Physical Exam Triage Vital Signs ED Triage Vitals  Encounter Vitals Group     BP 09/20/23 1726 123/79     Systolic BP Percentile --      Diastolic BP Percentile --      Pulse  Rate 09/20/23 1726 71     Resp 09/20/23 1726 20     Temp 09/20/23 1726 97.9 F (36.6 C)     Temp Source 09/20/23 1726 Oral     SpO2 09/20/23 1726 94 %     Weight --      Height --      Head Circumference --      Peak Flow --      Pain Score 09/20/23 1719 0     Pain Loc --      Pain Education --      Exclude from Growth Chart --    No data found.  Updated Vital Signs BP 123/79 (BP Location: Right Arm)   Pulse 71   Temp 97.9 F (36.6 C) (Oral)   Resp 20    SpO2 94%   Visual Acuity Right Eye Distance:   Left Eye Distance:   Bilateral Distance:    Right Eye Near:   Left Eye Near:    Bilateral Near:     Physical Exam Vitals and nursing note reviewed.  Constitutional:      General: She is not in acute distress.    Appearance: Normal appearance. She is not ill-appearing or toxic-appearing.  HENT:     Head: Normocephalic and atraumatic.     Right Ear: Tympanic membrane, ear canal and external ear normal.     Left Ear: Tympanic membrane, ear canal and external ear normal.     Nose: No congestion or rhinorrhea.     Mouth/Throat:     Mouth: Mucous membranes are moist.     Pharynx: Oropharynx is clear. No oropharyngeal exudate or posterior oropharyngeal erythema.  Eyes:     General: No scleral icterus.    Extraocular Movements: Extraocular movements intact.  Cardiovascular:     Rate and Rhythm: Normal rate and regular rhythm.  Pulmonary:     Effort: Pulmonary effort is normal. No respiratory distress.     Breath sounds: Normal breath sounds. No wheezing, rhonchi or rales.  Abdominal:     General: Abdomen is flat. Bowel sounds are increased.     Palpations: Abdomen is soft.     Tenderness: There is abdominal tenderness. There is no guarding or rebound.  Musculoskeletal:     Cervical back: Normal range of motion and neck supple.  Lymphadenopathy:     Cervical: No cervical adenopathy.  Skin:    General: Skin is warm and dry.     Coloration: Skin is not jaundiced or pale.     Findings: No erythema or rash.  Neurological:     Mental Status: She is alert and oriented to person, place, and time.  Psychiatric:        Behavior: Behavior is cooperative.      UC Treatments / Results  Labs (all labs ordered are listed, but only abnormal results are displayed) Labs Reviewed  POCT INFLUENZA A/B - Normal  SARS CORONAVIRUS 2 (TAT 6-24 HRS)    EKG   Radiology No results found.  Procedures Procedures (including critical care  time)  Medications Ordered in UC Medications - No data to display  Initial Impression / Assessment and Plan / UC Course  I have reviewed the triage vital signs and the nursing notes.  Pertinent labs & imaging results that were available during my care of the patient were reviewed by me and considered in my medical decision making (see chart for details).  1. Viral URI with cough 2. 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 Diff infection and when to seek emergent care Otherwise, follow up with GI if symptoms not fully improve after treatment  The patient was given the opportunity to ask questions.  All questions answered to their satisfaction.  The patient is in agreement to this plan.   Final Clinical Impressions(s) / UC Diagnoses   Final diagnoses:  Viral URI with cough  Encounter for screening for COVID-19  Ulcerative proctitis with rectal bleeding [facility])     Discharge Instructions      It sounds like you have a new viral upper respiratory infection.  Symptoms should improve over the next week to 10 days.  If you develop chest pain or worsening shortness of breath, go to the emergency room.  Influenza test is negative.  You will see the results of the COVID-19 test in mychart.   Some things that can make you feel better are: - Increased rest - Increasing fluid with water/sugar free  electrolytes - Acetaminophen and ibuprofen as needed for fever/pain - Salt water gargling, chloraseptic spray and throat lozenges - OTC guaifenesin (Mucinex) 600 mg twice daily for congestion - Saline sinus flushes or a neti pot - Humidifying the air  To treat the colitis, take the metronidazole as prescribed.  Recommend pushing fluids and avoiding foods high in fiber.  Seek care emergently if symptoms do not improve with treatment or if they worsen despite treatment.     ED Prescriptions     Medication Sig Dispense Auth. Provider   metroNIDAZOLE (FLAGYL) 500 MG tablet Take 1 tablet (500 mg total) by mouth 2 (two) times daily for 10 days. 20 tablet Cathlean Marseilles A, NP   fluconazole (DIFLUCAN) 150 MG tablet Take 1 tablet (150 mg total) by mouth once for 1 dose. 1 tablet Valentino Nose, NP      PDMP not reviewed this encounter.   Valentino Nose, NP 09/21/23 [phone]

## 2023-09-20 NOTE — Discharge Instructions (Addendum)
It sounds like you have a new viral upper respiratory infection.  Symptoms should improve over the next week to 10 days.  If you develop chest pain or worsening shortness of breath, go to the emergency room.  Influenza test is negative.  You will see the results of the COVID-19 test in mychart.   Some things that can make you feel better are: - Increased rest - Increasing fluid with water/sugar free electrolytes - Acetaminophen and ibuprofen as needed for fever/pain - Salt water gargling, chloraseptic spray and throat lozenges - OTC guaifenesin (Mucinex) 600 mg twice daily for congestion - Saline sinus flushes or a neti pot - Humidifying the air  To treat the colitis, take the metronidazole as prescribed.  Recommend pushing fluids and avoiding foods high in fiber.  Seek care emergently if symptoms do not improve with treatment or if they worsen despite treatment.

## 2023-09-21 LAB — SARS CORONAVIRUS 2 (TAT 6-24 HRS): SARS Coronavirus 2: NEGATIVE

## 2023-10-17 ENCOUNTER — Other Ambulatory Visit (HOSPITAL_COMMUNITY): Payer: Self-pay

## 2023-10-17 MED ORDER — OXYCODONE HCL 10 MG PO TABS
10.0000 mg | ORAL_TABLET | Freq: Every day | ORAL | 0 refills | Status: AC
  Filled 2023-10-17: qty 150, 30d supply, fill #0

## 2023-10-17 MED ORDER — OXYCODONE HCL 10 MG PO TABS
10.0000 mg | ORAL_TABLET | Freq: Every day | ORAL | 0 refills | Status: AC
  Filled 2023-11-15: qty 150, 30d supply, fill #0

## 2023-10-17 MED ORDER — TOPIRAMATE 25 MG PO TABS
25.0000 mg | ORAL_TABLET | Freq: Two times a day (BID) | ORAL | 0 refills | Status: AC
  Filled 2023-10-17: qty 180, 90d supply, fill #0

## 2023-10-17 MED ORDER — METHOCARBAMOL 750 MG PO TABS
750.0000 mg | ORAL_TABLET | Freq: Two times a day (BID) | ORAL | 0 refills | Status: AC | PRN
  Filled 2023-10-17: qty 180, 90d supply, fill #0

## 2023-10-19 ENCOUNTER — Other Ambulatory Visit (HOSPITAL_COMMUNITY): Payer: Self-pay

## 2023-10-19 MED ORDER — FLUCONAZOLE 150 MG PO TABS
ORAL_TABLET | ORAL | 0 refills | Status: AC
  Filled 2023-10-19: qty 3, 7d supply, fill #0

## 2023-11-15 ENCOUNTER — Other Ambulatory Visit (HOSPITAL_COMMUNITY): Payer: Self-pay

## 2023-11-27 ENCOUNTER — Ambulatory Visit (INDEPENDENT_AMBULATORY_CARE_PROVIDER_SITE_OTHER)

## 2023-11-27 ENCOUNTER — Ambulatory Visit
Admission: RE | Admit: 2023-11-27 | Discharge: 2023-11-27 | Disposition: A | Discharge status: Home / Self Care | Source: Ambulatory Visit | Attending: Family Medicine | Admitting: Family Medicine

## 2023-11-27 ENCOUNTER — Other Ambulatory Visit: Payer: Self-pay

## 2023-11-27 VITALS — BP 134/82 | HR 82 | Temp 98.2°F | Resp 20

## 2023-11-27 DIAGNOSIS — J4521 Mild intermittent asthma with (acute) exacerbation: Secondary | ICD-10-CM | POA: Diagnosis not present

## 2023-11-27 DIAGNOSIS — J3089 Other allergic rhinitis: Secondary | ICD-10-CM | POA: Diagnosis not present

## 2023-11-27 DIAGNOSIS — R051 Acute cough: Secondary | ICD-10-CM | POA: Diagnosis not present

## 2023-11-27 MED ORDER — ALBUTEROL SULFATE HFA 108 (90 BASE) MCG/ACT IN AERS
2.0000 | INHALATION_SPRAY | RESPIRATORY_TRACT | 0 refills | Status: AC | PRN
Start: 2023-11-27 — End: ?

## 2023-11-27 MED ORDER — DEXAMETHASONE SODIUM PHOSPHATE 10 MG/ML IJ SOLN
10.0000 mg | Freq: Once | INTRAMUSCULAR | Status: AC
  Administered 2023-11-27: 10 mg via INTRAMUSCULAR

## 2023-11-27 NOTE — Discharge Instructions (Signed)
 We will let you know if anything comes back abnormal on your chest x-ray.

## 2023-11-27 NOTE — ED Provider Notes (Signed)
 [facility]    CSN: [id number] Arrival date & time: 11/27/23  1756      History   Chief Complaint Chief Complaint  Patient presents with   Cough    Entered by patient    HPI Sherry [name] is a 53 y.o. female.   Presenting today with over a week of nasal congestion, pressure of cough, chest tightness, occasional shortness of breath.  Denies chest pain, abdominal pain, vomiting, diarrhea.  History of seasonal allergies, asthma taking albuterol inhaler as needed, Allegra-D consistently.  Also taking Mucinex and herbal remedies with minimal relief.    Past Medical History:  Diagnosis Date   Allergy    Arthritis    Asthma    Back pain    Costochondritis    Depression    GERD (gastroesophageal reflux disease)    Headache    Kidney stones    Pityriasis rosea     There are no active problems to display for this patient.   Past Surgical History:  Procedure Laterality Date   ABDOMINAL HYSTERECTOMY     kidney stones     LAPAROSCOPY     LEEP     TONSILLECTOMY     WISDOM TOOTH EXTRACTION      OB History   No obstetric history on file.      Home Medications    Prior to Admission medications   Medication Sig Start Date End Date Taking? Authorizing Provider  albuterol (VENTOLIN HFA) 108 (90 Base) MCG/ACT inhaler Inhale 2 puffs into the lungs every 4 (four) hours as needed for wheezing or shortness of breath. 11/27/23   Particia Nearing, PA-C  ALPRAZolam Prudy Feeler) 0.5 MG tablet Take 1 tablet (0.5 mg total) by mouth 2 (two) times daily as needed for sleep. Needs office visit (second notice) Patient not taking: Reported on 08/01/2021 04/04/12   Nelva Nay, PA-C  aspirin 325 MG tablet Take 325 mg by mouth daily as needed.    [provider]  diphenhydrAMINE (BENADRYL) 50 MG tablet Take 1 tablet (50 mg total) by mouth once for 1 dose. Take 1 hour prior to CT scan 09/14/21 09/14/21  Bjorn Pippin, MD  EPINEPHrine (EPIPEN 2-PAK) 0.3 mg/0.3 mL SOAJ  injection Inject 0.3 mLs (0.3 mg total) into the muscle once. Patient not taking: Reported on 09/20/2023 11/03/13   Nelva Nay, PA-C  fexofenadine-pseudoephedrine (ALLEGRA-D) 60-120 MG per tablet Take 1 tablet by mouth every morning.     [provider]  fluconazole (DIFLUCAN) 150 MG tablet Take 1 tablet by mouth on days 1, 4 and 7 as needed 10/19/23     FLUoxetine (PROZAC) 40 MG capsule Take 40 mg by mouth daily.      [provider]  ibuprofen (ADVIL,MOTRIN) 800 MG tablet Take 800 mg by mouth as needed for pain.    [provider]  methocarbamol (ROBAXIN) 500 MG tablet Take 500 mg by mouth every 8 (eight) hours as needed for muscle spasms.     [provider]  methocarbamol (ROBAXIN) 750 MG tablet Take 1 tablet (750 mg total) by mouth 2 (two) times daily as needed for pain 08/09/22     methocarbamol (ROBAXIN) 750 MG tablet Take 1 tablet (750 mg total) by mouth 2 (two) times daily as needed for pain 10/18/22     methocarbamol (ROBAXIN) 750 MG tablet Take 1 tablet (750 mg total) by mouth 2 (two) times daily as needed for pain. 12/14/22     methocarbamol (ROBAXIN)  750 MG tablet Take 1 tablet (750 mg) by mouth 2 times daily as needed for pain 02/12/23     methocarbamol (ROBAXIN) 750 MG tablet Take 1 tablet (750 mg total) by mouth 2 (two) times daily as needed for pain 04/16/23     methocarbamol (ROBAXIN) 750 MG tablet Take 1 tablet (750 mg total) by mouth 2 (two) times daily as needed for pain. 06/13/23     methocarbamol (ROBAXIN) 750 MG tablet Take 1 tablet (750 mg total) by mouth 2 (two) times daily as needed for pain. 08/15/23     methocarbamol (ROBAXIN) 750 MG tablet Take 1 tablet (750 mg total) by mouth 2 (two) times daily as needed for pain 10/17/23     metroNIDAZOLE (METROGEL) 0.75 % vaginal gel Place 1 Applicatorful vaginally at bedtime. 09/29/21   Bjorn Pippin, MD  nitrofurantoin, macrocrystal-monohydrate, (MACROBID) 100 MG capsule 1 po prn following sexual  activity Patient not taking: Reported on 08/01/2021 06/16/21   Bjorn Pippin, MD  omeprazole (PRILOSEC) 20 MG capsule Take 20 mg by mouth daily as needed (indigestion).    [provider]  Oxycodone HCl 10 MG TABS Take 1 tablet by mouth five times a day as needed for pain 02/15/22     Oxycodone HCl 10 MG TABS Take 1 tablet (10 mg total) by mouth 5 (five) times daily as needed for pain. 08/09/22     Oxycodone HCl 10 MG TABS Take 1 tablet (10 mg total) by mouth 5 (five) times daily as needed for pain 10/18/22     Oxycodone HCl 10 MG TABS Take 1 tablet (10 mg total) by mouth 5 (five) times daily as needed for pain. 12/14/22     Oxycodone HCl 10 MG TABS Take 1 tablet (10 mg) by mouth 5 times daily as needed for pain (fill 03/12/23) 03/12/23     Oxycodone HCl 10 MG TABS Take 1 tablet (10 mg) by mouth 5 times daily as needed for pain 02/12/23     Oxycodone HCl 10 MG TABS Take 1 tablet (10 mg total) by mouth 5 (five) times daily as needed for pain (fill 05/15/23) 05/15/23     Oxycodone HCl 10 MG TABS Take 1 tablet (10 mg total) by mouth 5 (five) times daily as needed for pain 04/16/23     Oxycodone HCl 10 MG TABS Take 1 tablet (10 mg total) by mouth 5 (five) times daily as needed for pain 06/13/23     Oxycodone HCl 10 MG TABS Take 1 tablet (10 mg total) by mouth 5 (five) times daily as needed for pain. 08/15/23     Oxycodone HCl 10 MG TABS Take 1 tablet (10 mg total) by mouth 5 (five) times daily as needed for pain. 09/13/23     Oxycodone HCl 10 MG TABS Take 1 tablet (10 mg total) by mouth 5 (five) times daily as needed for pain 11/14/23     Oxycodone HCl 10 MG TABS Take 1 tablet (10 mg total) by mouth 5 (five) times daily as needed for pain. 10/17/23     Oxycodone-Acetaminophen (PERCOCET PO) Take by mouth.    [provider]  predniSONE (DELTASONE) 20 MG tablet Take 2 tablets (40 mg total) by mouth daily with breakfast. 12/17/21   Particia Nearing, PA-C  predniSONE (DELTASONE) 50 MG tablet Take 1  tablet by mouth 13 hours prior to CT Then take 1 tablet by mouth 7 hours prior to CT Then take 1 tablet by mouth 1 hour prior  to CT 09/15/21   Bjorn Pippin, MD  PREMARIN vaginal cream Use 0.5gm nightly vaginally for 2 weeks and then 2-3x weekly. 06/16/21   Bjorn Pippin, MD  promethazine-dextromethorphan (PROMETHAZINE-DM) 6.25-15 MG/5ML syrup Take 5 mLs by mouth 4 (four) times daily as needed. 12/17/21   Particia Nearing, PA-C  promethazine-dextromethorphan (PROMETHAZINE-DM) 6.25-15 MG/5ML syrup Take 5 mLs by mouth 4 (four) times daily as needed. 09/07/23   Particia Nearing, PA-C  ranitidine (ZANTAC) 150 MG tablet take 1 tablet by mouth twice a day if needed for ACID REFLUX 01/18/14   Godfrey Pick, PA-C  topiramate (TOPAMAX) 25 MG tablet Take 25 mg by mouth 2 (two) times daily.    [provider]  topiramate (TOPAMAX) 25 MG tablet Take 1 tablet (25 mg total) by mouth 2 (two) times daily. 08/09/22     topiramate (TOPAMAX) 25 MG tablet Take 1 tablet (25 mg total) by mouth 2 (two) times daily. 10/18/22     topiramate (TOPAMAX) 25 MG tablet Take 1 tablet (25 mg total) by mouth 2 (two) times daily. 12/14/22     topiramate (TOPAMAX) 25 MG tablet Take 1 tablet (25 mg) by mouth 2 times daily. 02/12/23     topiramate (TOPAMAX) 25 MG tablet Take 1 tablet (25 mg total) by mouth 2 (two) times daily. 04/16/23     topiramate (TOPAMAX) 25 MG tablet Take 1 tablet (25 mg total) by mouth 2 (two) times daily. 06/13/23     topiramate (TOPAMAX) 25 MG tablet Take 1 tablet (25 mg total) by mouth 2 (two) times daily. 08/15/23     topiramate (TOPAMAX) 25 MG tablet Take 1 tablet (25 mg total) by mouth 2 (two) times daily. 10/17/23       Family History Family History  Problem Relation Age of Onset   Hypertension Mother     Social History Social History   Tobacco Use   Smoking status: Former    Types: E-cigarettes   Smokeless tobacco: Current  Substance Use Topics   Alcohol use: Yes    Comment:  occasional   Drug use: No     Allergies   Latex, Buspar [buspirone], and Iohexol   Review of Systems Review of Systems Per HPI  Physical Exam Triage Vital Signs ED Triage Vitals  Encounter Vitals Group     BP 11/27/23 1800 134/82     Systolic BP Percentile --      Diastolic BP Percentile --      Pulse Rate 11/27/23 1800 82     Resp 11/27/23 1800 20     Temp 11/27/23 1800 98.2 F (36.8 C)     Temp Source 11/27/23 1800 Oral     SpO2 11/27/23 1800 98 %     Weight --      Height --      Head Circumference --      Peak Flow --      Pain Score 11/27/23 1802 4     Pain Loc --      Pain Education --      Exclude from Growth Chart --    No data found.  Updated Vital Signs BP 134/82 (BP Location: Right Arm)   Pulse 82   Temp 98.2 F (36.8 C) (Oral)   Resp 20   SpO2 98%   Visual Acuity Right Eye Distance:   Left Eye Distance:   Bilateral Distance:    Right Eye Near:   Left Eye Near:    Bilateral  Near:     Physical Exam Vitals and nursing note reviewed.  Constitutional:      Appearance: Normal appearance.  HENT:     Head: Atraumatic.     Right Ear: Tympanic membrane and external ear normal.     Left Ear: Tympanic membrane and external ear normal.     Nose: Rhinorrhea present.     Mouth/Throat:     Mouth: Mucous membranes are moist.     Pharynx: Posterior oropharyngeal erythema present.  Eyes:     Extraocular Movements: Extraocular movements intact.     Conjunctiva/sclera: Conjunctivae normal.  Cardiovascular:     Rate and Rhythm: Normal rate and regular rhythm.     Heart sounds: Normal heart sounds.  Pulmonary:     Effort: Pulmonary effort is normal.     Breath sounds: Normal breath sounds. No wheezing or rales.  Musculoskeletal:        General: Normal range of motion.     Cervical back: Normal range of motion and neck supple.  Skin:    General: Skin is warm and dry.  Neurological:     Mental Status: She is alert and oriented to person, place, and  time.  Psychiatric:        Mood and Affect: Mood normal.        Thought Content: Thought content normal.      UC Treatments / Results  Labs (all labs ordered are listed, but only abnormal results are displayed) Labs Reviewed - No data to display  EKG   Radiology DG Chest 2 View Result Date: 11/27/2023 CLINICAL DATA:  Productive cough EXAM: CHEST - 2 VIEW COMPARISON:  Chest x-ray 12/17/2021 FINDINGS: There central peribronchial wall thickening. There is no focal lung infiltrate, pleural effusion or pneumothorax. The cardiomediastinal silhouette is within normal limits. No acute fractures are seen. IMPRESSION: Central peribronchial wall thickening, which can be seen with viral bronchiolitis or reactive airways disease. No focal lung infiltrate. Electronically Signed   By: Darliss Cheney M.D.   On: 11/27/2023 19:00    Procedures Procedures (including critical care time)  Medications Ordered in UC Medications  dexamethasone (DECADRON) injection 10 mg (10 mg Intramuscular Given 11/27/23 1840)    Initial Impression / Assessment and Plan / UC Course  I have reviewed the triage vital signs and the nursing notes.  Pertinent labs & imaging results that were available during my care of the patient were reviewed by me and considered in my medical decision making (see chart for details).     Vitals and exam very reassuring today, chest x-ray negative for pneumonia, suspect seasonal allergy and asthma exacerbation.  Treat with IM Decadron, albuterol, continue allergy regimen, albuterol as needed.  Return for worsening symptoms.  Final Clinical Impressions(s) / UC Diagnoses   Final diagnoses:  Acute cough  Seasonal allergic rhinitis due to other allergic trigger  Mild intermittent asthma with acute exacerbation     Discharge Instructions      We will let you know if anything comes back abnormal on your chest x-ray.    ED Prescriptions     Medication Sig Dispense Auth. Provider    albuterol (VENTOLIN HFA) 108 (90 Base) MCG/ACT inhaler Inhale 2 puffs into the lungs every 4 (four) hours as needed for wheezing or shortness of breath. 18 g Particia Nearing, [place]      PDMP not reviewed this encounter.   Particia Nearing, [place] 11/27/23 1953

## 2023-11-27 NOTE — ED Triage Notes (Addendum)
 Pt reports nasal congestion and productive cough x1 week. Pt reports phlegm is now "brownish color", intermittent shortness of breath, chills, fatigue.  NAD noted.

## 2023-12-12 ENCOUNTER — Other Ambulatory Visit (HOSPITAL_COMMUNITY): Payer: Self-pay

## 2023-12-12 MED ORDER — OXYCODONE HCL 10 MG PO TABS
10.0000 mg | ORAL_TABLET | Freq: Every day | ORAL | 0 refills | Status: AC | PRN
  Filled 2024-01-15: qty 150, 30d supply, fill #0

## 2023-12-12 MED ORDER — METHOCARBAMOL 750 MG PO TABS
750.0000 mg | ORAL_TABLET | Freq: Two times a day (BID) | ORAL | 0 refills | Status: AC | PRN
  Filled 2023-12-12: qty 17, 9d supply, fill #0
  Filled 2023-12-12: qty 163, 81d supply, fill #0

## 2023-12-12 MED ORDER — OXYCODONE HCL 10 MG PO TABS
10.0000 mg | ORAL_TABLET | Freq: Every day | ORAL | 0 refills | Status: AC
  Filled 2023-12-12 – 2023-12-13 (×2): qty 150, 30d supply, fill #0

## 2023-12-12 MED ORDER — TOPIRAMATE 25 MG PO TABS
25.0000 mg | ORAL_TABLET | Freq: Two times a day (BID) | ORAL | 0 refills | Status: AC
  Filled 2023-12-12: qty 180, 90d supply, fill #0

## 2023-12-13 ENCOUNTER — Other Ambulatory Visit (HOSPITAL_COMMUNITY): Payer: Self-pay

## 2023-12-15 ENCOUNTER — Other Ambulatory Visit (HOSPITAL_COMMUNITY): Payer: Self-pay

## 2024-01-15 ENCOUNTER — Other Ambulatory Visit (HOSPITAL_COMMUNITY): Payer: Self-pay

## 2024-01-16 ENCOUNTER — Other Ambulatory Visit (HOSPITAL_COMMUNITY): Payer: Self-pay

## 2024-02-11 ENCOUNTER — Other Ambulatory Visit (HOSPITAL_COMMUNITY): Payer: Self-pay

## 2024-02-11 MED ORDER — OXYCODONE HCL 10 MG PO TABS: 10.0000 mg | ORAL_TABLET | Freq: Every day | ORAL | 0 refills | Status: DC

## 2024-02-11 MED ORDER — OXYCODONE HCL 10 MG PO TABS
10.0000 mg | ORAL_TABLET | Freq: Every day | ORAL | 0 refills | Status: DC | PRN
  Filled 2024-02-11: qty 150, 32d supply, fill #0
  Filled 2024-02-13: qty 150, 30d supply, fill #0

## 2024-02-11 MED ORDER — METHOCARBAMOL 750 MG PO TABS
750.0000 mg | ORAL_TABLET | Freq: Two times a day (BID) | ORAL | 0 refills | Status: AC | PRN
  Filled 2024-02-11: qty 180, 90d supply, fill #0

## 2024-02-11 MED ORDER — TOPIRAMATE 25 MG PO TABS
25.0000 mg | ORAL_TABLET | Freq: Two times a day (BID) | ORAL | 0 refills | Status: AC
  Filled 2024-02-11: qty 180, 90d supply, fill #0

## 2024-02-13 ENCOUNTER — Other Ambulatory Visit: Payer: Self-pay

## 2024-02-18 ENCOUNTER — Other Ambulatory Visit (HOSPITAL_COMMUNITY): Payer: Self-pay

## 2024-03-20 ENCOUNTER — Other Ambulatory Visit (HOSPITAL_COMMUNITY): Payer: Self-pay

## 2024-03-20 MED ORDER — OXYCODONE HCL 10 MG PO TABS
10.0000 mg | ORAL_TABLET | Freq: Every day | ORAL | 0 refills | Status: AC
  Filled 2024-03-20: qty 150, 30d supply, fill #0

## 2024-04-10 ENCOUNTER — Other Ambulatory Visit (HOSPITAL_COMMUNITY): Payer: Self-pay

## 2024-04-10 MED ORDER — METHOCARBAMOL 750 MG PO TABS
750.0000 mg | ORAL_TABLET | Freq: Two times a day (BID) | ORAL | 0 refills | Status: AC | PRN
  Filled 2024-04-10: qty 180, 90d supply, fill #0

## 2024-04-10 MED ORDER — OXYCODONE HCL 10 MG PO TABS
10.0000 mg | ORAL_TABLET | Freq: Every day | ORAL | 0 refills | Status: AC | PRN
  Filled 2024-04-10: qty 150, 32d supply, fill #0
  Filled 2024-04-15: qty 150, 30d supply, fill #0
  Filled 2024-04-19: qty 150, 32d supply, fill #0

## 2024-04-10 MED ORDER — TOPIRAMATE 25 MG PO TABS
25.0000 mg | ORAL_TABLET | Freq: Two times a day (BID) | ORAL | 0 refills | Status: AC
  Filled 2024-04-10: qty 180, 90d supply, fill #0

## 2024-04-10 MED ORDER — OXYCODONE HCL 10 MG PO TABS
10.0000 mg | ORAL_TABLET | Freq: Every day | ORAL | 0 refills | Status: AC | PRN
Start: 2024-05-09 — End: ?
  Filled 2024-05-20: qty 150, 30d supply, fill #0

## 2024-04-11 ENCOUNTER — Other Ambulatory Visit (HOSPITAL_COMMUNITY): Payer: Self-pay

## 2024-04-15 ENCOUNTER — Other Ambulatory Visit (HOSPITAL_COMMUNITY): Payer: Self-pay

## 2024-04-19 ENCOUNTER — Other Ambulatory Visit (HOSPITAL_COMMUNITY): Payer: Self-pay

## 2024-05-13 ENCOUNTER — Other Ambulatory Visit (HOSPITAL_COMMUNITY): Payer: Self-pay

## 2024-05-13 MED ORDER — WEGOVY 1 MG/0.5ML ~~LOC~~ SOAJ
1.0000 mg | SUBCUTANEOUS | 0 refills | Status: DC
  Filled 2024-05-13: qty 2, 28d supply, fill #0

## 2024-05-20 ENCOUNTER — Other Ambulatory Visit (HOSPITAL_COMMUNITY): Payer: Self-pay

## 2024-05-20 ENCOUNTER — Other Ambulatory Visit: Payer: Self-pay

## 2024-05-21 ENCOUNTER — Other Ambulatory Visit (HOSPITAL_COMMUNITY): Payer: Self-pay

## 2024-05-21 MED ORDER — FLUOXETINE HCL 20 MG PO CAPS
20.0000 mg | ORAL_CAPSULE | Freq: Every day | ORAL | 3 refills | Status: AC
  Filled 2024-05-21: qty 90, 90d supply, fill #0
  Filled 2024-08-12: qty 90, 90d supply, fill #1

## 2024-06-10 ENCOUNTER — Other Ambulatory Visit (HOSPITAL_COMMUNITY): Payer: Self-pay

## 2024-06-11 ENCOUNTER — Other Ambulatory Visit (HOSPITAL_COMMUNITY): Payer: Self-pay

## 2024-06-11 MED ORDER — SEMAGLUTIDE-WEIGHT MANAGEMENT 1 MG/0.5ML ~~LOC~~ SOAJ
1.0000 mg | SUBCUTANEOUS | 0 refills | Status: DC
  Filled 2024-06-11: qty 2, 28d supply, fill #0

## 2024-06-17 ENCOUNTER — Other Ambulatory Visit (HOSPITAL_COMMUNITY): Payer: Self-pay

## 2024-06-17 MED ORDER — METHOCARBAMOL 750 MG PO TABS
750.0000 mg | ORAL_TABLET | Freq: Two times a day (BID) | ORAL | 0 refills | Status: AC | PRN
  Filled 2024-06-17: qty 180, 90d supply, fill #0

## 2024-06-17 MED ORDER — OXYCODONE HCL 10 MG PO TABS
10.0000 mg | ORAL_TABLET | Freq: Every day | ORAL | 0 refills | Status: AC | PRN
  Filled 2024-06-17: qty 150, 30d supply, fill #0

## 2024-06-17 MED ORDER — OXYCODONE HCL 10 MG PO TABS
10.0000 mg | ORAL_TABLET | Freq: Every day | ORAL | 0 refills | Status: AC | PRN
  Filled 2024-07-18: qty 150, 30d supply, fill #0

## 2024-06-17 MED ORDER — TOPIRAMATE 25 MG PO TABS
25.0000 mg | ORAL_TABLET | Freq: Two times a day (BID) | ORAL | 0 refills | Status: AC
  Filled 2024-06-17: qty 180, 90d supply, fill #0

## 2024-06-18 ENCOUNTER — Other Ambulatory Visit (HOSPITAL_COMMUNITY): Payer: Self-pay

## 2024-06-18 MED ORDER — WEGOVY 1 MG/0.5ML ~~LOC~~ SOAJ
1.0000 mg | SUBCUTANEOUS | 0 refills | Status: DC
  Filled 2024-06-18: qty 2, 28d supply, fill #0

## 2024-07-01 ENCOUNTER — Other Ambulatory Visit (HOSPITAL_COMMUNITY): Payer: Self-pay

## 2024-07-01 MED ORDER — AMOXICILLIN-POT CLAVULANATE 875-125 MG PO TABS
1.0000 | ORAL_TABLET | Freq: Two times a day (BID) | ORAL | 0 refills | Status: AC
  Filled 2024-07-01: qty 14, 7d supply, fill #0

## 2024-07-01 MED ORDER — BENZONATATE 200 MG PO CAPS
200.0000 mg | ORAL_CAPSULE | Freq: Three times a day (TID) | ORAL | 0 refills | Status: AC | PRN
  Filled 2024-07-01: qty 20, 7d supply, fill #0

## 2024-07-01 MED ORDER — ALBUTEROL SULFATE HFA 108 (90 BASE) MCG/ACT IN AERS
2.0000 | INHALATION_SPRAY | RESPIRATORY_TRACT | 0 refills | Status: AC | PRN
  Filled 2024-07-01: qty 6.7, 17d supply, fill #0

## 2024-07-01 MED ORDER — FLUCONAZOLE 150 MG PO TABS
150.0000 mg | ORAL_TABLET | Freq: Once | ORAL | 1 refills | Status: AC
  Filled 2024-07-01: qty 1, 1d supply, fill #0

## 2024-07-04 ENCOUNTER — Other Ambulatory Visit (HOSPITAL_COMMUNITY): Payer: Self-pay

## 2024-07-04 MED ORDER — FEXOFENADINE-PSEUDOEPHED ER 60-120 MG PO TB12
1.0000 | ORAL_TABLET | Freq: Two times a day (BID) | ORAL | 11 refills | Status: AC | PRN
  Filled 2024-07-04: qty 60, 30d supply, fill #0
  Filled 2024-09-07: qty 60, 30d supply, fill #1

## 2024-07-07 ENCOUNTER — Other Ambulatory Visit (HOSPITAL_COMMUNITY): Payer: Self-pay

## 2024-07-07 MED ORDER — WEGOVY 1.7 MG/0.75ML ~~LOC~~ SOAJ
1.7000 mg | SUBCUTANEOUS | 0 refills | Status: AC
  Filled 2024-07-07: qty 0.75, 7d supply, fill #0
  Filled 2024-07-31: qty 3, 28d supply, fill #0

## 2024-07-07 MED ORDER — WEGOVY 1.7 MG/0.75ML ~~LOC~~ SOAJ
1.7000 mg | SUBCUTANEOUS | 0 refills | Status: DC
  Filled 2024-07-07 (×2): qty 3, 28d supply, fill #0

## 2024-07-08 ENCOUNTER — Other Ambulatory Visit (HOSPITAL_COMMUNITY): Payer: Self-pay

## 2024-07-11 ENCOUNTER — Other Ambulatory Visit (HOSPITAL_COMMUNITY): Payer: Self-pay

## 2024-07-18 ENCOUNTER — Other Ambulatory Visit (HOSPITAL_COMMUNITY): Payer: Self-pay

## 2024-07-31 ENCOUNTER — Other Ambulatory Visit (HOSPITAL_COMMUNITY): Payer: Self-pay

## 2024-08-03 ENCOUNTER — Other Ambulatory Visit (HOSPITAL_COMMUNITY): Payer: Self-pay

## 2024-08-13 ENCOUNTER — Other Ambulatory Visit (HOSPITAL_COMMUNITY): Payer: Self-pay

## 2024-08-13 MED ORDER — METHOCARBAMOL 750 MG PO TABS
750.0000 mg | ORAL_TABLET | Freq: Two times a day (BID) | ORAL | 0 refills | Status: AC | PRN
  Filled 2024-08-13: qty 180, 90d supply, fill #0

## 2024-08-13 MED ORDER — OXYCODONE HCL 10 MG PO TABS
10.0000 mg | ORAL_TABLET | Freq: Every day | ORAL | 0 refills | Status: AC
  Filled 2024-09-15 (×2): qty 150, 30d supply, fill #0

## 2024-08-13 MED ORDER — OXYCODONE HCL 10 MG PO TABS
10.0000 mg | ORAL_TABLET | Freq: Every day | ORAL | 0 refills | Status: AC
  Filled 2024-08-13 – 2024-08-17 (×2): qty 150, 30d supply, fill #0

## 2024-08-13 MED ORDER — TOPIRAMATE 25 MG PO TABS
25.0000 mg | ORAL_TABLET | Freq: Two times a day (BID) | ORAL | 0 refills | Status: AC
  Filled 2024-08-13: qty 180, 90d supply, fill #0

## 2024-08-14 ENCOUNTER — Other Ambulatory Visit (HOSPITAL_COMMUNITY): Payer: Self-pay

## 2024-08-17 ENCOUNTER — Other Ambulatory Visit (HOSPITAL_COMMUNITY): Payer: Self-pay

## 2024-08-18 ENCOUNTER — Other Ambulatory Visit: Payer: Self-pay

## 2024-09-10 ENCOUNTER — Other Ambulatory Visit (HOSPITAL_COMMUNITY): Payer: Self-pay

## 2024-09-11 ENCOUNTER — Other Ambulatory Visit (HOSPITAL_COMMUNITY): Payer: Self-pay

## 2024-09-11 ENCOUNTER — Other Ambulatory Visit: Payer: Self-pay

## 2024-09-11 MED ORDER — WEGOVY 2.4 MG/0.75ML ~~LOC~~ SOAJ
2.4000 mg | SUBCUTANEOUS | 5 refills | Status: AC
  Filled 2024-09-11: qty 3, 28d supply, fill #0

## 2024-09-12 ENCOUNTER — Other Ambulatory Visit (HOSPITAL_COMMUNITY): Payer: Self-pay

## 2024-09-15 ENCOUNTER — Other Ambulatory Visit (HOSPITAL_COMMUNITY): Payer: Self-pay

## 2024-09-15 ENCOUNTER — Other Ambulatory Visit: Payer: Self-pay
